# Patient Record
Sex: Female | Born: 1990 | Race: White | Hispanic: No | Marital: Single | Smoking: Never smoker
Health system: Southern US, Community
[De-identification: ages and names within clinical notes are randomized; demographics above are authoritative.]

## PROBLEM LIST (undated history)

## (undated) DIAGNOSIS — Z789 Other specified health status: Secondary | ICD-10-CM

## (undated) DIAGNOSIS — F419 Anxiety disorder, unspecified: Secondary | ICD-10-CM

## (undated) HISTORY — DX: Anxiety disorder, unspecified: F41.9

## (undated) HISTORY — PX: WISDOM TOOTH EXTRACTION: SHX21

---

## 2018-09-05 DIAGNOSIS — Z6832 Body mass index (BMI) 32.0-32.9, adult: Secondary | ICD-10-CM | POA: Diagnosis not present

## 2018-09-05 DIAGNOSIS — Z01419 Encounter for gynecological examination (general) (routine) without abnormal findings: Secondary | ICD-10-CM | POA: Diagnosis not present

## 2019-01-09 ENCOUNTER — Encounter (HOSPITAL_COMMUNITY): Payer: Self-pay | Admitting: Emergency Medicine

## 2019-01-09 ENCOUNTER — Other Ambulatory Visit: Payer: Self-pay

## 2019-01-09 ENCOUNTER — Ambulatory Visit
Admission: EM | Admit: 2019-01-09 | Discharge: 2019-01-09 | Disposition: A | Discharge status: Home / Self Care | Payer: BC Managed Care – PPO | Source: Home / Self Care

## 2019-01-09 ENCOUNTER — Inpatient Hospital Stay (HOSPITAL_COMMUNITY): Payer: BC Managed Care – PPO

## 2019-01-09 ENCOUNTER — Inpatient Hospital Stay (HOSPITAL_COMMUNITY)
Admission: EM | Admit: 2019-01-09 | Discharge: 2019-01-13 | DRG: 418 | Disposition: A | Discharge status: Home / Self Care | Payer: BC Managed Care – PPO | Attending: General Surgery | Admitting: General Surgery

## 2019-01-09 ENCOUNTER — Emergency Department (HOSPITAL_COMMUNITY): Payer: BC Managed Care – PPO

## 2019-01-09 DIAGNOSIS — K8070 Calculus of gallbladder and bile duct without cholecystitis without obstruction: Secondary | ICD-10-CM | POA: Diagnosis not present

## 2019-01-09 DIAGNOSIS — K819 Cholecystitis, unspecified: Secondary | ICD-10-CM | POA: Diagnosis not present

## 2019-01-09 DIAGNOSIS — D72819 Decreased white blood cell count, unspecified: Secondary | ICD-10-CM | POA: Diagnosis not present

## 2019-01-09 DIAGNOSIS — R1011 Right upper quadrant pain: Secondary | ICD-10-CM | POA: Diagnosis not present

## 2019-01-09 DIAGNOSIS — K851 Biliary acute pancreatitis without necrosis or infection: Principal | ICD-10-CM | POA: Diagnosis present

## 2019-01-09 DIAGNOSIS — Z87440 Personal history of urinary (tract) infections: Secondary | ICD-10-CM

## 2019-01-09 DIAGNOSIS — K8512 Biliary acute pancreatitis with infected necrosis: Secondary | ICD-10-CM | POA: Diagnosis not present

## 2019-01-09 DIAGNOSIS — Z03818 Encounter for observation for suspected exposure to other biological agents ruled out: Secondary | ICD-10-CM | POA: Diagnosis not present

## 2019-01-09 DIAGNOSIS — K8309 Other cholangitis: Secondary | ICD-10-CM | POA: Diagnosis not present

## 2019-01-09 DIAGNOSIS — K8062 Calculus of gallbladder and bile duct with acute cholecystitis without obstruction: Secondary | ICD-10-CM | POA: Diagnosis not present

## 2019-01-09 DIAGNOSIS — K8020 Calculus of gallbladder without cholecystitis without obstruction: Secondary | ICD-10-CM | POA: Diagnosis not present

## 2019-01-09 DIAGNOSIS — R17 Unspecified jaundice: Secondary | ICD-10-CM | POA: Diagnosis not present

## 2019-01-09 DIAGNOSIS — K8051 Calculus of bile duct without cholangitis or cholecystitis with obstruction: Secondary | ICD-10-CM | POA: Diagnosis not present

## 2019-01-09 DIAGNOSIS — Z793 Long term (current) use of hormonal contraceptives: Secondary | ICD-10-CM

## 2019-01-09 DIAGNOSIS — K810 Acute cholecystitis: Secondary | ICD-10-CM | POA: Diagnosis not present

## 2019-01-09 DIAGNOSIS — E876 Hypokalemia: Secondary | ICD-10-CM | POA: Diagnosis not present

## 2019-01-09 DIAGNOSIS — Z20828 Contact with and (suspected) exposure to other viral communicable diseases: Secondary | ICD-10-CM | POA: Diagnosis present

## 2019-01-09 DIAGNOSIS — R1084 Generalized abdominal pain: Secondary | ICD-10-CM

## 2019-01-09 DIAGNOSIS — R109 Unspecified abdominal pain: Secondary | ICD-10-CM | POA: Insufficient documentation

## 2019-01-09 DIAGNOSIS — K8010 Calculus of gallbladder with chronic cholecystitis without obstruction: Secondary | ICD-10-CM | POA: Diagnosis not present

## 2019-01-09 HISTORY — DX: Other specified health status: Z78.9

## 2019-01-09 LAB — URINALYSIS, ROUTINE W REFLEX MICROSCOPIC
Glucose, UA: NEGATIVE mg/dL
Hgb urine dipstick: NEGATIVE
Ketones, ur: 20 mg/dL — AB
Nitrite: NEGATIVE
Protein, ur: 30 mg/dL — AB
Specific Gravity, Urine: 1.025 (ref 1.005–1.030)
pH: 5 (ref 5.0–8.0)

## 2019-01-09 LAB — COMPREHENSIVE METABOLIC PANEL
ALT: 197 U/L — ABNORMAL HIGH (ref 0–44)
AST: 103 U/L — ABNORMAL HIGH (ref 15–41)
Albumin: 3.8 g/dL (ref 3.5–5.0)
Alkaline Phosphatase: 434 U/L — ABNORMAL HIGH (ref 38–126)
Anion gap: 11 (ref 5–15)
BUN: 5 mg/dL — ABNORMAL LOW (ref 6–20)
CO2: 23 mmol/L (ref 22–32)
Calcium: 9 mg/dL (ref 8.9–10.3)
Chloride: 104 mmol/L (ref 98–111)
Creatinine, Ser: 0.48 mg/dL (ref 0.44–1.00)
GFR calc Af Amer: >60 mL/min (ref >60)
GFR calc non Af Amer: >60 mL/min (ref >60)
Glucose, Bld: 98 mg/dL (ref 70–99)
Potassium: 3.2 mmol/L — ABNORMAL LOW (ref 3.5–5.1)
Sodium: 138 mmol/L (ref 135–145)
Total Bilirubin: 5.5 mg/dL — ABNORMAL HIGH (ref 0.3–1.2)
Total Protein: 7.4 g/dL (ref 6.5–8.1)

## 2019-01-09 LAB — CBC WITH DIFFERENTIAL/PLATELET
Abs Immature Granulocytes: 0.02 10*3/uL (ref 0.00–0.07)
Basophils Absolute: 0 10*3/uL (ref 0.0–0.1)
Basophils Relative: 0 %
Eosinophils Absolute: 0 10*3/uL (ref 0.0–0.5)
Eosinophils Relative: 0 %
HCT: 43.6 % (ref 36.0–46.0)
Hemoglobin: 14.2 g/dL (ref 12.0–15.0)
Immature Granulocytes: 0 %
Lymphocytes Relative: 20 %
Lymphs Abs: 1.2 10*3/uL (ref 0.7–4.0)
MCH: 29.6 pg (ref 26.0–34.0)
MCHC: 32.6 g/dL (ref 30.0–36.0)
MCV: 91 fL (ref 80.0–100.0)
Monocytes Absolute: 0.4 10*3/uL (ref 0.1–1.0)
Monocytes Relative: 7 %
Neutro Abs: 4.2 10*3/uL (ref 1.7–7.7)
Neutrophils Relative %: 73 %
Platelets: 193 10*3/uL (ref 150–400)
RBC: 4.79 MIL/uL (ref 3.87–5.11)
RDW: 13.6 % (ref 11.5–15.5)
WBC: 5.9 10*3/uL (ref 4.0–10.5)
nRBC: 0 % (ref 0.0–0.2)

## 2019-01-09 LAB — HCG, SERUM, QUALITATIVE: Preg, Serum: NEGATIVE

## 2019-01-09 LAB — LIPASE, BLOOD: Lipase: 775 U/L — ABNORMAL HIGH (ref 11–51)

## 2019-01-09 LAB — MAGNESIUM: Magnesium: 2 mg/dL (ref 1.7–2.4)

## 2019-01-09 LAB — SARS CORONAVIRUS 2 BY RT PCR (HOSPITAL ORDER, PERFORMED IN ~~LOC~~ HOSPITAL LAB): SARS Coronavirus 2: NEGATIVE

## 2019-01-09 MED ORDER — ONDANSETRON HCL 4 MG PO TABS: 4.0000 mg | ORAL_TABLET | Freq: Four times a day (QID) | ORAL | Status: DC | PRN

## 2019-01-09 MED ORDER — ONDANSETRON HCL 4 MG/2ML IJ SOLN
4.0000 mg | Freq: Once | INTRAMUSCULAR | Status: AC
  Administered 2019-01-09: 4 mg via INTRAVENOUS
  Filled 2019-01-09: qty 2

## 2019-01-09 MED ORDER — ONDANSETRON HCL 4 MG/2ML IJ SOLN
4.0000 mg | Freq: Four times a day (QID) | INTRAMUSCULAR | Status: DC
  Administered 2019-01-12 – 2019-01-13 (×2): 4 mg via INTRAVENOUS
  Filled 2019-01-09 (×7): qty 2

## 2019-01-09 MED ORDER — LACTATED RINGERS IV SOLN
INTRAVENOUS | Status: DC
  Administered 2019-01-09 – 2019-01-10 (×3): via INTRAVENOUS

## 2019-01-09 MED ORDER — MORPHINE SULFATE (PF) 4 MG/ML IV SOLN
4.0000 mg | Freq: Once | INTRAVENOUS | Status: AC
  Administered 2019-01-09: 4 mg via INTRAVENOUS
  Filled 2019-01-09: qty 1

## 2019-01-09 MED ORDER — ONDANSETRON HCL 4 MG/2ML IJ SOLN
4.0000 mg | Freq: Four times a day (QID) | INTRAMUSCULAR | Status: DC | PRN
  Administered 2019-01-10: 15:00:00 4 mg via INTRAVENOUS

## 2019-01-09 MED ORDER — HYDROMORPHONE HCL 1 MG/ML IJ SOLN
0.5000 mg | Freq: Four times a day (QID) | INTRAMUSCULAR | Status: DC
  Administered 2019-01-10 – 2019-01-13 (×3): 0.5 mg via INTRAVENOUS
  Filled 2019-01-09 (×6): qty 0.5

## 2019-01-09 MED ORDER — IOHEXOL 300 MG/ML  SOLN
100.0000 mL | Freq: Once | INTRAMUSCULAR | Status: AC | PRN
  Administered 2019-01-09: 17:00:00 100 mL via INTRAVENOUS

## 2019-01-09 MED ORDER — SODIUM CHLORIDE 0.9 % IV BOLUS
500.0000 mL | Freq: Once | INTRAVENOUS | Status: AC
  Administered 2019-01-09: 500 mL via INTRAVENOUS

## 2019-01-09 MED ORDER — PIPERACILLIN-TAZOBACTAM 3.375 G IVPB
3.3750 g | Freq: Three times a day (TID) | INTRAVENOUS | Status: DC
  Administered 2019-01-09 – 2019-01-13 (×11): 3.375 g via INTRAVENOUS
  Filled 2019-01-09 (×11): qty 50

## 2019-01-09 MED ORDER — ACETAMINOPHEN 650 MG RE SUPP: 650.0000 mg | Freq: Four times a day (QID) | RECTAL | Status: DC | PRN

## 2019-01-09 MED ORDER — ENOXAPARIN SODIUM 40 MG/0.4ML ~~LOC~~ SOLN
40.0000 mg | SUBCUTANEOUS | Status: DC
  Administered 2019-01-09 – 2019-01-12 (×4): 40 mg via SUBCUTANEOUS
  Filled 2019-01-09 (×4): qty 0.4

## 2019-01-09 MED ORDER — POTASSIUM CHLORIDE 10 MEQ/100ML IV SOLN
10.0000 meq | INTRAVENOUS | Status: AC
  Administered 2019-01-09 (×4): 10 meq via INTRAVENOUS
  Filled 2019-01-09 (×4): qty 100

## 2019-01-09 MED ORDER — ACETAMINOPHEN 325 MG PO TABS
650.0000 mg | ORAL_TABLET | Freq: Four times a day (QID) | ORAL | Status: DC | PRN
  Administered 2019-01-11: 650 mg via ORAL
  Filled 2019-01-09: qty 2

## 2019-01-09 MED ORDER — PIPERACILLIN-TAZOBACTAM 3.375 G IVPB 30 MIN
3.3750 g | Freq: Once | INTRAVENOUS | Status: AC
  Administered 2019-01-09: 3.375 g via INTRAVENOUS
  Filled 2019-01-09: qty 50

## 2019-01-09 MED ORDER — HYDROMORPHONE HCL 1 MG/ML IJ SOLN
1.0000 mg | INTRAMUSCULAR | Status: DC | PRN
  Administered 2019-01-10: 1 mg via INTRAVENOUS
  Filled 2019-01-09: qty 1

## 2019-01-09 NOTE — ED Triage Notes (Signed)
Upon triage pt is determined to be jaundice and is having abdominal pain , encouraged pt to go to ed for further evaluation

## 2019-01-09 NOTE — Progress Notes (Signed)
GI consultation by Mr. Walden Field, DNP and Dr. Oneida Alar appreciated. Abdominal pelvic CT reveals multiple stones in the gallbladder with gallbladder wall thickening as well as dilated bile duct with 2 lucent stones with calcified rims.  Study also shows mild changes of pancreatitis. Patient is pain-free since she received pain medication. She has biliary pancreatitis as well as gallbladder changes suggest cholecystitis and she may also have an element of cholangitis and urine analysis suggest UTI. She is and is on Zosyn. We will plan ERCP with sphincterotomy and stone extraction on 01/10/2019.  She will also need cholecystectomy. Procedure risks reviewed with the patient and she is agreeable. I also met with her husband Wille Glaser remain up-to-date on patient's condition.

## 2019-01-09 NOTE — ED Provider Notes (Addendum)
[facility] EMERGENCY DEPARTMENT Provider Note   CSN: [id number] Arrival date & time: 01/09/19  1156     History   Chief Complaint Chief Complaint  Patient presents with  . Abdominal Pain  . Jaundice    HPI Sonya Mcdonald is a 28 y.o. female without significant past medical hx who presents to the ED w/ complaints of abdominal pain & jaundice which she first noted around 11AM yesterday. Patient states sxs seem to develop fairly simultaneously- she noted a yellow discoloration to her eyes & skin & pain to the upper abdomen which has been waxing/waning since onset. Her current discomfort is a 7/10 in severity, worse with supine position & walking around, no alleviating factors. Tried some zantac w/o relief. Reports nausea w/ 1 episode of non bloody emesis. Has also noted more yellow color to her urine w/ increased urinary frequency similar to prior UTIs. Denies fever, chills, hematemesis, chest pain, dyspnea, melena, hematochezia, diarrhea, constipation, dysuria, vaginal bleeding, or vaginal discharge. She reports occassional EtOH use- states she drinks approximately 2 times per week w/ variable amounts- typically 5-6 drinks each time, last consumed EtOH a few days ago. Denies recent travel. Denies sick contacts w/ similar sxs.      HPI  History reviewed. No pertinent past medical history.  There are no active problems to display for this patient.   History reviewed. No pertinent surgical history.   OB History   No obstetric history on file.      Home Medications    Prior to Admission medications   Not on File    Family History No family history on file.  Social History Social History   Tobacco Use  . Smoking status: Never Smoker  . Smokeless tobacco: Never Used  Substance Use Topics  . Alcohol use: Yes    Comment: occ  . Drug use: Never     Allergies   Patient has no allergy information on record.  Review of Systems Review of Systems  Constitutional:  Negative for chills and fever.  Respiratory: Negative for shortness of breath.   Cardiovascular: Negative for chest pain.  Gastrointestinal: Positive for abdominal pain, nausea and vomiting. Negative for anal bleeding, blood in stool, constipation and diarrhea.  Genitourinary: Positive for frequency and urgency. Negative for dysuria, vaginal bleeding and vaginal discharge.  Skin: Positive for color change.  All other systems reviewed and are negative.    Physical Exam Updated Vital Signs BP 138/86 (BP Location: Right Arm)   Pulse 97   Temp 98 F (36.7 C) (Oral)   Resp 14   Ht '5\' 7"'  (1.702 m)   Wt 88.9 kg   LMP 12/30/2018   SpO2 96%   BMI 30.70 kg/m   Physical Exam Vitals signs and nursing note reviewed.  Constitutional:      General: She is not in acute distress.    Appearance: She is well-developed. She is not toxic-appearing.  HENT:     Head: Normocephalic and atraumatic.  Eyes:     General: Scleral icterus (mild) present.        Right eye: No discharge.        Left eye: No discharge.  Neck:     Musculoskeletal: Neck supple.  Cardiovascular:     Rate and Rhythm: Normal rate and regular rhythm.  Pulmonary:     Effort: Pulmonary effort is normal. No respiratory distress.     Breath sounds: Normal breath sounds. No wheezing, rhonchi or rales.  Abdominal:  General: There is no distension.     Palpations: Abdomen is soft.     Tenderness: There is abdominal tenderness in the right upper quadrant, epigastric area and left upper quadrant. There is no right CVA tenderness, left CVA tenderness, guarding or rebound.  Skin:    General: Skin is warm and dry.     Coloration: Skin is jaundiced (mild).     Findings: No rash.  Neurological:     Mental Status: She is alert.     Comments: Clear speech.   Psychiatric:        Behavior: Behavior normal.    ED Treatments / Results  Labs (all labs ordered are listed, but only abnormal results are displayed) Labs Reviewed   COMPREHENSIVE METABOLIC PANEL - Abnormal; Notable for the following components:      Result Value   Potassium 3.2 (*)    BUN 5 (*)    AST 103 (*)    ALT 197 (*)    Alkaline Phosphatase 434 (*)    Total Bilirubin 5.5 (*)    All other components within normal limits  LIPASE, BLOOD - Abnormal; Notable for the following components:   Lipase 775 (*)    All other components within normal limits  URINALYSIS, ROUTINE W REFLEX MICROSCOPIC - Abnormal; Notable for the following components:   Color, Urine AMBER (*)    APPearance HAZY (*)    Bilirubin Urine MODERATE (*)    Ketones, ur 20 (*)    Protein, ur 30 (*)    Leukocytes,Ua TRACE (*)    Bacteria, UA RARE (*)    All other components within normal limits  URINE CULTURE  CBC WITH DIFFERENTIAL/PLATELET  HCG, SERUM, QUALITATIVE    EKG None  Radiology US Abdomen Limited Ruq  Result Date: 01/09/2019 CLINICAL DATA:  Jaundice with right upper quadrant pain EXAM: ULTRASOUND ABDOMEN LIMITED RIGHT UPPER QUADRANT COMPARISON:  None. FINDINGS: Gallbladder: Within the gallbladder, there are multiple echogenic foci which move and shadow consistent with cholelithiasis. Largest gallstone measures 1.1 cm in length. The gallbladder wall appears mildly thickened and slightly edematous. There is no pericholecystic fluid. No sonographic Murphy sign noted by sonographer. Common bile duct: Diameter: 9 mm which is prominent. Note that portions of the distal common bile duct are obscured by gas. There is questionable focus of air within the intrahepatic biliary ductal system. Liver: No focal lesion identified. Within normal limits in parenchymal echogenicity. Portal vein is patent on color Doppler imaging with normal direction of blood flow towards the liver. IMPRESSION: 1. Cholelithiasis with mild gallbladder wall thickening. Gallbladder wall appears slightly edematous. Suspect early acute cholecystitis. 2. Prominence of the common bile duct proximally. More  distally, the common bile duct is obscured by gas. A distal calculus cannot be excluded given this circumstance. From an imaging standpoint, MRCP would be the imaging study of choice to further evaluate. There is a focal area of apparent air within the intrahepatic biliary ductal system. If patient has had previous sphincterotomy, air in the biliary ductal system could be explained on an iatrogenic basis. Given the other findings, early cholangitis is of concern given this finding. Electronically Signed   By: Lowella Grip III M.D.   On: 01/09/2019 14:13    Procedures Procedures (including critical care time)  CRITICAL CARE Performed by: Kennith Maes   Total critical care time: 30 minutes  Critical care time was exclusive of separately billable procedures and treating other patients.  Critical care was necessary to treat or  prevent imminent or life-threatening deterioration.  Critical care was time spent personally by me on the following activities: development of treatment plan with patient and/or surrogate as well as nursing, discussions with consultants, evaluation of patient's response to treatment, examination of patient, obtaining history from patient or surrogate, ordering and performing treatments and interventions, ordering and review of laboratory studies, ordering and review of radiographic studies, pulse oximetry and re-evaluation of patient's condition.  Medications Ordered in ED Medications - No data to display   Initial Impression / Assessment and Plan / ED Course  I have reviewed the triage vital signs and the nursing notes.  Pertinent labs & imaging results that were available during my care of the patient were reviewed by me and considered in my medical decision making (see chart for details).   Patient presents to the ED w/ jaundice & upper abdominal pain. She is nontoxic appearing, in no apparent distress, vitals WNL. Exam w/ mild scleral icterus & skin  jaundice w/ upper abdominal tenderness to palpation, no peritoneal signs.   DDX:  Choledocholithiasis, cholelithiasis, cholecystitis, cholangitis, pancreatitis, gastritis, hepatitis.  Plan: Labs & RUQ [place] with antiemetics, analgesics, & fluids.   Preg test: Negative UA: bilirubin, ketones, & protein present. Trace leuks, rare bacteria, contaminated- doubt UTI- culture sent.  CBC: No leukocytosis. No Anemia CMP: LFTs elevated- AST 103, ALT 197, alk phos 434, t bili 5.5. renal function preserved. Mild hypokalemia @ 3.2.  Lipase: elevated @ 775  RUQ [place]: Patient w/ cholelithiasis, findings suspicious for early cholecystitis, prominence of proximal CBD, difficult to visualize more distal portion of the CBD. Focal area of air within intrahepatic biliary ductal system- patient has not had prior sphincterotomy, given other findings per radiology early cholangitis is of concern.   Zosyn started for coverage of cholecystitis/cholangitis.  Will discuss w/ GI.   14:30: RE-EVAL: Patient states significant improvement in pain, she is resting comfortably, discussed results & plan of care thus far, provided opportunity for questions, she has confirmed understanding.   14:32: CONSULT: Discussed w/ gastroenterologist Dr. Oneida Alar who has also discussed w/ Dr. Laural Golden who will perform ERCP, requesting CT abdomen w contrast as opposed to MRCP, hospitalist admission.   14:45: CONSULT: Discussed case with hospitalist Dr. Manuella Ghazi who accepts admission.   15:32: CONSULT: Discussed w/ general surgeon Dr. Arnoldo Morale who is aware of patient.   Findings and plan of care discussed with supervising physician Dr. Roderic Palau who has evaluated patient & is in agreement.   Final Clinical Impressions(s) / ED Diagnoses   Final diagnoses:  Abdominal pain  Gallstone pancreatitis  Cholecystitis  Cholangitis    ED Discharge Orders    None       Amaryllis Dyke, PA-C 01/09/19 1536    Shelitha Magley, [place], PA-C  01/09/19 1751    Milton Ferguson, MD 01/11/19 1547

## 2019-01-09 NOTE — Progress Notes (Signed)
Pharmacy Antibiotic Note  Delorese Sellin is a 28 y.o. female admitted on 01/09/2019 with intra-abdominal infection.  Pharmacy has been consulted for Zosyn dosing.  Plan: Zosyn 3.375g IV q8h (4 hour infusion).  Monitor labs, c/s, and patient improvement.  Height: 5\' 7"  (170.2 cm) Weight: 196 lb (88.9 kg) IBW/kg (Calculated) : 61.6  Temp (24hrs), Avg:98 F (36.7 C), Min:98 F (36.7 C), Max:98 F (36.7 C)  Recent Labs  Lab 01/09/19 1309  WBC 5.9  CREATININE 0.48    Estimated Creatinine Clearance: 119.8 mL/min (by C-G formula based on SCr of 0.48 mg/dL).    Not on File  Antimicrobials this admission: Zosyn 7/16 >>     Dose adjustments this admission: N/A  Microbiology results: 7/16 UCx: pending   Thank you for allowing pharmacy to be a part of this patient's care.  Ramond Craver 01/09/2019 3:53 PM

## 2019-01-09 NOTE — ED Notes (Signed)
Hinton Dyer, RN informed to hold Lovenox tomorrow for EGD.

## 2019-01-09 NOTE — H&P (Signed)
History and Physical    Sonya Simpsonatricia Zirbel YNW:[id number] DOB: 1991/02/12 DOA: 01/09/2019  PCP: Patient, No Pcp Per   Patient coming from: Home  Chief Complaint: Abdominal pain and jaundice  HPI: Sonya Mcdonald is a 28 y.o. female with no significant past medical history who presented to the emergency department with sudden onset right upper quadrant abdominal pain along with some nausea and one episode of emesis that began yesterday.  She denies any fevers or chills, nor has she had any hematemesis, or diarrhea.  She denies any sick contacts.  She does state that her skin has been turning more yellow in color as well.  She states that the pain is dull and achy in nature and does not seem to radiate.  She denies any aggravating or alleviating factors.  She has never had pain similar to this previously.  She states that she only drinks alcohol socially and on occasion.  She denies any tobacco use.   ED Course: Vital signs are stable and patient is afebrile.  Laboratory data notable for some transaminitis that is mild with AST 103 and ALT 197 and lipase 775.  Bilirubin is mildly elevated at 5.5.  Potassium is 3.2.  Right upper quadrant abdominal ultrasound notable for cholelithiasis and findings of early acute cholecystitis along with possible bile duct obstruction.  EDP has spoken with GI who will follow-up CT scan and consider ERCP.  COVID test pending.  Pregnancy test negative.  Review of Systems: As per HPI otherwise 10 point review of systems negative.   History reviewed. No pertinent past medical history.  History reviewed. No pertinent surgical history.   reports that she has never smoked. She has never used smokeless tobacco. She reports current alcohol use. She reports that she does not use drugs.  Not on File  History reviewed. No pertinent family history.  Prior to Admission medications   Medication Sig Start Date End Date Taking? Authorizing Provider  norgestimate-ethinyl  estradiol (SPRINTEC 28) 0.25-35 MG-MCG tablet Take 1 tablet by mouth daily.  08/23/18  Yes [provider]    Physical Exam: Vitals:   01/09/19 1204 01/09/19 1205 01/09/19 1330  BP: 138/86  112/66  Pulse: 97  81  Resp: 14  18  Temp: 98 F (36.7 C)    TempSrc: Oral    SpO2: 96%  99%  Weight:  88.9 kg   Height:  5\' 7"  (1.702 m)     Constitutional: NAD, calm, comfortable, does not appear jaundiced on my examination Vitals:   01/09/19 1204 01/09/19 1205 01/09/19 1330  BP: 138/86  112/66  Pulse: 97  81  Resp: 14  18  Temp: 98 F (36.7 C)    TempSrc: Oral    SpO2: 96%  99%  Weight:  88.9 kg   Height:  5\' 7"  (1.702 m)    Eyes: lids and conjunctivae normal ENMT: Mucous membranes are moist.  Neck: normal, supple Respiratory: clear to auscultation bilaterally. Normal respiratory effort. No accessory muscle use.  Cardiovascular: Regular rate and rhythm, no murmurs. No extremity edema. Abdomen: Minimal tenderness with positive Murphy sign, no distention. Bowel sounds positive.  Musculoskeletal:  No joint deformity upper and lower extremities.   Skin: no rashes, lesions, ulcers.  Psychiatric: Normal judgment and insight. Alert and oriented x 3. Normal mood.   Labs on Admission: I have personally reviewed following labs and imaging studies  CBC: Recent Labs  Lab 01/09/19 1309  WBC 5.9  NEUTROABS 4.2  HGB 14.2  HCT  43.6  MCV 91.0  PLT 725   Basic Metabolic Panel: Recent Labs  Lab 01/09/19 1309  NA 138  K 3.2*  CL 104  CO2 23  GLUCOSE 98  BUN 5*  CREATININE 0.48  CALCIUM 9.0   GFR: Estimated Creatinine Clearance: 119.8 mL/min (by C-G formula based on SCr of 0.48 mg/dL). Liver Function Tests: Recent Labs  Lab 01/09/19 1309  AST 103*  ALT 197*  ALKPHOS 434*  BILITOT 5.5*  PROT 7.4  ALBUMIN 3.8   Recent Labs  Lab 01/09/19 1309  LIPASE 775*   No results for input(s): AMMONIA in the last 168 hours. Coagulation Profile: No results for input(s):  INR, PROTIME in the last 168 hours. Cardiac Enzymes: No results for input(s): CKTOTAL, CKMB, CKMBINDEX, TROPONINI in the last 168 hours. BNP (last 3 results) No results for input(s): PROBNP in the last 8760 hours. HbA1C: No results for input(s): HGBA1C in the last 72 hours. CBG: No results for input(s): GLUCAP in the last 168 hours. Lipid Profile: No results for input(s): CHOL, HDL, LDLCALC, TRIG, CHOLHDL, LDLDIRECT in the last 72 hours. Thyroid Function Tests: No results for input(s): TSH, T4TOTAL, FREET4, T3FREE, THYROIDAB in the last 72 hours. Anemia Panel: No results for input(s): VITAMINB12, FOLATE, FERRITIN, TIBC, IRON, RETICCTPCT in the last 72 hours. Urine analysis:    Component Value Date/Time   COLORURINE AMBER (A) 01/09/2019 1313   APPEARANCEUR HAZY (A) 01/09/2019 1313   LABSPEC 1.025 01/09/2019 1313   PHURINE 5.0 01/09/2019 1313   GLUCOSEU NEGATIVE 01/09/2019 1313   HGBUR NEGATIVE 01/09/2019 1313   BILIRUBINUR MODERATE (A) 01/09/2019 1313   KETONESUR 20 (A) 01/09/2019 1313   PROTEINUR 30 (A) 01/09/2019 1313   NITRITE NEGATIVE 01/09/2019 1313   LEUKOCYTESUR TRACE (A) 01/09/2019 1313    Radiological Exams on Admission: US Abdomen Limited Ruq  Result Date: 01/09/2019 CLINICAL DATA:  Jaundice with right upper quadrant pain EXAM: ULTRASOUND ABDOMEN LIMITED RIGHT UPPER QUADRANT COMPARISON:  None. FINDINGS: Gallbladder: Within the gallbladder, there are multiple echogenic foci which move and shadow consistent with cholelithiasis. Largest gallstone measures 1.1 cm in length. The gallbladder wall appears mildly thickened and slightly edematous. There is no pericholecystic fluid. No sonographic Murphy sign noted by sonographer. Common bile duct: Diameter: 9 mm which is prominent. Note that portions of the distal common bile duct are obscured by gas. There is questionable focus of air within the intrahepatic biliary ductal system. Liver: No focal lesion identified. Within normal  limits in parenchymal echogenicity. Portal vein is patent on color Doppler imaging with normal direction of blood flow towards the liver. IMPRESSION: 1. Cholelithiasis with mild gallbladder wall thickening. Gallbladder wall appears slightly edematous. Suspect early acute cholecystitis. 2. Prominence of the common bile duct proximally. More distally, the common bile duct is obscured by gas. A distal calculus cannot be excluded given this circumstance. From an imaging standpoint, MRCP would be the imaging study of choice to further evaluate. There is a focal area of apparent air within the intrahepatic biliary ductal system. If patient has had previous sphincterotomy, air in the biliary ductal system could be explained on an iatrogenic basis. Given the other findings, early cholangitis is of concern given this finding. Electronically Signed   By: Lowella Grip III M.D.   On: 01/09/2019 14:13    Assessment/Plan Active Problems:   Acute cholecystitis    Acute cholecystitis with choledocholithiasis -Consult to general surgery and GI for assistance in management -CT scan of the abdomen pending and  GI aware for possible ERCP -N.p.o. except medications and maintain on aggressive IV fluid -Continue IV Zosyn -Zofran as needed for nausea or vomiting -Pain medications IV to be ordered for pain control -Repeat a.m. labs with CMP  Mild hypokalemia -Replete IV -Recheck labs in a.m. along with magnesium   DVT prophylaxis: Lovenox Code Status: Full Family Communication: None at bedside Disposition Plan:Admit for GI and Gen Surg evaluation  Consults called:GI and GS Admission status: Inpatient, MedSurg   Icelyn Navarrete Hoover BrunetteD [place] Maul[place] DO [facility] Pager [phone]  If 7PM-7AM, please contact night-coverage [email or web address] Password [email or web address]  01/09/2019, 3:20 PM

## 2019-01-09 NOTE — ED Triage Notes (Signed)
Pt states that she has been having upper right abd pain since yesterday and she is also yellow

## 2019-01-09 NOTE — Consult Note (Signed)
Referring Provider: No ref. provider found Primary Care Physician:  Patient, No Pcp Per Primary Gastroenterologist:  Dr. Darrick PennaFields (previously unassigned)  Date of Admission: 01/09/19 Date of Consultation: 01/09/19  Reason for Consultation:  Abdominal pain, elevated LFTs, elevated lipase, dilated CBD  HPI:  Sonya Mcdonald is a 28 y.o. female with no significant past medical history who presented to the ER with complaints of abdominal pain, nausea, non-bloody emesis, and yellowing of her skin and urine. Reviewed ER provider notes.  She first noticed her abdominal pain yellowing 11 AM yesterday which spontaneously developed.  Abdominal pain waxing and waning since onset and currently moderately severe in intensity with 7 out of 10.  Worsened with walking around in supine position.  Has tried Zantac without relief.  One episode of nonbloody emesis denies fever, chills, hematochezia, melena.  Occasional alcohol use about twice a week from 5-6 drinks per sitting with last consumption a few days ago.  Her vitals are stable in emergency department.  Labs including CBC was normal, CMP with elevated AST/ALT at 103/197, elevated alkaline phosphatase of 434, elevated bilirubin at 5.5.  Lipase significantly elevated at 775.  Serum pregnancy test negative.  COVID-19 testing pending.  Right upper quadrant ultrasound found likely early cholecystitis with cholelithiasis noting multiple echogenic foci in the gallbladder and gallbladder wall thickening and edema.  Also noted CBD prominence at 9 mm with distal CBD obscured by gas and distal calculus cannot be excluded.  Recommended further evaluation.  Focal air in the intrahepatic biliary ductal system query recent sphincterotomy versus possible early cholangitis.  Today she states she was in her usual state of health until yesterday around 6211 AM she began having yellowing of her skin and eyes as well as significant abdominal pain worse at the epigastric area but  notable in her generalized abdomen.  Only one episode of emesis without obvious blood.  No hematochezia or melena.  She does complain of chronic constipation but this is not been evaluated previously.  Denies hematochezia, melena, fever, chills.  Drinks alcohol about twice a week with about 5 drinks per sitting, denies recreational drugs.  Her last oral intake was this morning a "mini sausage biscuit" she had while eating breakfast with her children.  No other GI complaints.  History reviewed. No pertinent past medical history.  History reviewed. No pertinent surgical history.  Prior to Admission medications   Medication Sig Start Date End Date Taking? Authorizing Provider  norgestimate-ethinyl estradiol (SPRINTEC 28) 0.25-35 MG-MCG tablet Take 1 tablet by mouth daily.  08/23/18  Yes [provider]    Current Facility-Administered Medications  Medication Dose Route Frequency Provider Last Rate Last Dose  . piperacillin-tazobactam (ZOSYN) IVPB 3.375 g  3.375 g Intravenous Once Petrucelli, Samantha R, PA-C 100 mL/hr at 01/09/19 1438 3.375 g at 01/09/19 1438   Current Outpatient Medications  Medication Sig Dispense Refill  . norgestimate-ethinyl estradiol (SPRINTEC 28) 0.25-35 MG-MCG tablet Take 1 tablet by mouth daily.       Allergies as of 01/09/2019  . (Not on File)    History reviewed. No pertinent family history.  Social History   Socioeconomic History  . Marital status: Single    Spouse name: Not on file  . Number of children: Not on file  . Years of education: Not on file  . Highest education level: Not on file  Occupational History  . Not on file  Social Needs  . Financial resource strain: Not on file  . Food insecurity  Worry: Not on file    Inability: Not on file  . Transportation needs    Medical: Not on file    Non-medical: Not on file  Tobacco Use  . Smoking status: Never Smoker  . Smokeless tobacco: Never Used  Substance and Sexual Activity  .  Alcohol use: Yes    Comment: occ  . Drug use: Never  . Sexual activity: Yes    Birth control/protection: Pill  Lifestyle  . Physical activity    Days per week: Not on file    Minutes per session: Not on file  . Stress: Not on file  Relationships  . Social Herbalist on phone: Not on file    Gets together: Not on file    Attends religious service: Not on file    Active member of club or organization: Not on file    Attends meetings of clubs or organizations: Not on file    Relationship status: Not on file  . Intimate partner violence    Fear of current or ex partner: Not on file    Emotionally abused: Not on file    Physically abused: Not on file    Forced sexual activity: Not on file  Other Topics Concern  . Not on file  Social History Narrative  . Not on file    Review of Systems: General: Negative for anorexia, weight loss, fever, chills, fatigue, weakness. ENT: Negative for hoarseness, difficulty swallowing. CV: Negative for chest pain, angina, palpitations, peripheral edema.  Respiratory: Negative for dyspnea at rest, cough, sputum, wheezing.  GI: See history of present illness. GU:  Darkened yellow urine MS: Negative for joint pain, low back pain.  Derm: Negative for rash or itching.  Endo: Negative for unusual weight change.  Heme: Negative for bruising or bleeding. Allergy: Negative for rash or hives.  Physical Exam: Vital signs in last 24 hours: Temp:  [98 F (36.7 C)] 98 F (36.7 C) (07/16 1204) Pulse Rate:  [81-97] 81 (07/16 1330) Resp:  [14-18] 18 (07/16 1330) BP: (112-138)/(66-86) 112/66 (07/16 1330) SpO2:  [96 %-99 %] 99 % (07/16 1330) Weight:  [88.9 kg] 88.9 kg (07/16 1205)   General:   Alert,  Well-developed, well-nourished, pleasant and cooperative in NAD Head:  Normocephalic and atraumatic. Eyes:  Mild scleral icterus. Ears:  Normal auditory acuity. Neck:  Supple; no masses or thyromegaly. Lungs:  Clear throughout to auscultation.  No wheezes, crackles, or rhonchi. No acute distress. Heart:  Regular rate and rhythm; no murmurs, clicks, rubs,  or gallops. Abdomen:  Soft, and nondistended. Generalized abdominal TTP worse in the epigastrium. No masses, hepatosplenomegaly or hernias noted. Normal bowel sounds, without guarding, and without rebound.   Rectal:  Deferred until time of colonoscopy.   Msk:  Symmetrical without gross deformities. Pulses:  Normal bilateral DP pulses noted. Extremities:  Without clubbing or edema. Neurologic:  Alert and  oriented x4;  grossly normal neurologically. Psych:  Alert and cooperative. Normal mood and affect.  Intake/Output from previous day: No intake/output data recorded. Intake/Output this shift: Total I/O In: 500 [IV Piggyback:500] Out: -   Lab Results: Recent Labs    01/09/19 1309  WBC 5.9  HGB 14.2  HCT 43.6  PLT 193   BMET Recent Labs    01/09/19 1309  NA 138  K 3.2*  CL 104  CO2 23  GLUCOSE 98  BUN 5*  CREATININE 0.48  CALCIUM 9.0   LFT Recent Labs    01/09/19  1309  PROT 7.4  ALBUMIN 3.8  AST 103*  ALT 197*  ALKPHOS 434*  BILITOT 5.5*   PT/INR No results for input(s): LABPROT, INR in the last 72 hours. Hepatitis Panel No results for input(s): HEPBSAG, HCVAB, HEPAIGM, HEPBIGM in the last 72 hours. C-Diff No results for input(s): CDIFFTOX in the last 72 hours.  Studies/Results: [place] Abdomen Limited Ruq  Result Date: 01/09/2019 CLINICAL DATA:  Jaundice with right upper quadrant pain EXAM: ULTRASOUND ABDOMEN LIMITED RIGHT UPPER QUADRANT COMPARISON:  None. FINDINGS: Gallbladder: Within the gallbladder, there are multiple echogenic foci which move and shadow consistent with cholelithiasis. Largest gallstone measures 1.1 cm in length. The gallbladder wall appears mildly thickened and slightly edematous. There is no pericholecystic fluid. No sonographic Murphy sign noted by sonographer. Common bile duct: Diameter: 9 mm which is prominent. Note that  portions of the distal common bile duct are obscured by gas. There is questionable focus of air within the intrahepatic biliary ductal system. Liver: No focal lesion identified. Within normal limits in parenchymal echogenicity. Portal vein is patent on color Doppler imaging with normal direction of blood flow towards the liver. IMPRESSION: 1. Cholelithiasis with mild gallbladder wall thickening. Gallbladder wall appears slightly edematous. Suspect early acute cholecystitis. 2. Prominence of the common bile duct proximally. More distally, the common bile duct is obscured by gas. A distal calculus cannot be excluded given this circumstance. From an imaging standpoint, MRCP would be the imaging study of choice to further evaluate. There is a focal area of apparent air within the intrahepatic biliary ductal system. If patient has had previous sphincterotomy, air in the biliary ductal system could be explained on an iatrogenic basis. Given the other findings, early cholangitis is of concern given this finding. Electronically Signed   By: Bretta BangWilliam  Woodruff III M.D.   On: 01/09/2019 14:13    Impression: Very pleasant 28 year old female who presents for new onset of moderate to severe waxing/waning abdominal pain, nausea, emesis, yellowing of her skin and eyes.  Ultrasound imaging shows multiple gallstones and likely early acute cholecystitis as well as prominent common bile duct and air in the common bile duct concerning for choledocholithiasis and possible early cholangitis.  Labs demonstrate elevated LFTs as well as elevated lipase.  No leukocytosis or anemia.  Overall likely with choledocholithiasis and gallstone pancreatitis as well as acute cholecystitis and possible early cholangitis.  Vitals are stable, no features of moderate or severe cholangitis nor sepsis; will need to monitor for any emerging symptoms.  CT imaging is ordered and pending which will allow further evaluation for any severe  pancreatitis.  Plan: 1. Keep NPO 2. Zosyn q 6 hours for possible early cholangitis 3. Monitor LFTs 4. Agree with CT imaging 5. Planned ERCP in the morning with Dr. Karilyn Cotaehman 6. Would likely benefit from surgical evaluation at some point during IP stay 7. Supportive measures 8. Further recommendations pending labs, CT, and ERCP results.   Thank you for allowing us to participate in the care of Earl LagosPatricia Behringer  Zoriah Pulice, DNP, AGNP-C Adult & Gerontological Nurse Practitioner [facility]   LOS: 0 days     01/09/2019, 2:51 PM

## 2019-01-10 ENCOUNTER — Inpatient Hospital Stay (HOSPITAL_COMMUNITY): Payer: BC Managed Care – PPO | Admitting: Anesthesiology

## 2019-01-10 ENCOUNTER — Inpatient Hospital Stay (HOSPITAL_COMMUNITY): Payer: BC Managed Care – PPO

## 2019-01-10 ENCOUNTER — Encounter (HOSPITAL_COMMUNITY)
Admission: EM | Disposition: A | Discharge status: Home / Self Care | Payer: Self-pay | Source: Home / Self Care | Attending: Internal Medicine

## 2019-01-10 ENCOUNTER — Encounter (HOSPITAL_COMMUNITY): Payer: Self-pay | Admitting: Anesthesiology

## 2019-01-10 DIAGNOSIS — K8051 Calculus of bile duct without cholangitis or cholecystitis with obstruction: Secondary | ICD-10-CM

## 2019-01-10 DIAGNOSIS — K851 Biliary acute pancreatitis without necrosis or infection: Principal | ICD-10-CM

## 2019-01-10 HISTORY — PX: ERCP: SHX5425

## 2019-01-10 HISTORY — PX: SPHINCTEROTOMY: SHX5544

## 2019-01-10 HISTORY — PX: REMOVAL OF STONES: SHX5545

## 2019-01-10 LAB — COMPREHENSIVE METABOLIC PANEL
ALT: 136 U/L — ABNORMAL HIGH (ref 0–44)
AST: 62 U/L — ABNORMAL HIGH (ref 15–41)
Albumin: 3.2 g/dL — ABNORMAL LOW (ref 3.5–5.0)
Alkaline Phosphatase: 332 U/L — ABNORMAL HIGH (ref 38–126)
Anion gap: 14 (ref 5–15)
BUN: <5 mg/dL — ABNORMAL LOW (ref 6–20)
CO2: 22 mmol/L (ref 22–32)
Calcium: 8.6 mg/dL — ABNORMAL LOW (ref 8.9–10.3)
Chloride: 110 mmol/L (ref 98–111)
Creatinine, Ser: 0.47 mg/dL (ref 0.44–1.00)
GFR calc Af Amer: >60 mL/min (ref >60)
GFR calc non Af Amer: >60 mL/min (ref >60)
Glucose, Bld: 76 mg/dL (ref 70–99)
Potassium: 4 mmol/L (ref 3.5–5.1)
Sodium: 146 mmol/L — ABNORMAL HIGH (ref 135–145)
Total Bilirubin: 3.7 mg/dL — ABNORMAL HIGH (ref 0.3–1.2)
Total Protein: 6.6 g/dL (ref 6.5–8.1)

## 2019-01-10 LAB — URINE CULTURE: Culture: NO GROWTH

## 2019-01-10 LAB — CBC
HCT: 39.8 % (ref 36.0–46.0)
Hemoglobin: 12.8 g/dL (ref 12.0–15.0)
MCH: 29.4 pg (ref 26.0–34.0)
MCHC: 32.2 g/dL (ref 30.0–36.0)
MCV: 91.5 fL (ref 80.0–100.0)
Platelets: 173 10*3/uL (ref 150–400)
RBC: 4.35 MIL/uL (ref 3.87–5.11)
RDW: 13.8 % (ref 11.5–15.5)
WBC: 3.8 10*3/uL — ABNORMAL LOW (ref 4.0–10.5)
nRBC: 0 % (ref 0.0–0.2)

## 2019-01-10 LAB — LIPASE, BLOOD
Lipase: 289 U/L — ABNORMAL HIGH (ref 11–51)
Lipase: 112 U/L — ABNORMAL HIGH (ref 11–51)

## 2019-01-10 SURGERY — ERCP, WITH INTERVENTION IF INDICATED
Anesthesia: General

## 2019-01-10 MED ORDER — MIDAZOLAM HCL 2 MG/2ML IJ SOLN
INTRAMUSCULAR | Status: AC
  Filled 2019-01-10: qty 2

## 2019-01-10 MED ORDER — PROPOFOL 10 MG/ML IV BOLUS
INTRAVENOUS | Status: DC | PRN
  Administered 2019-01-10: 150 mg via INTRAVENOUS

## 2019-01-10 MED ORDER — SODIUM CHLORIDE 0.9 % IV SOLN
INTRAVENOUS | Status: AC
  Filled 2019-01-10: qty 100

## 2019-01-10 MED ORDER — GLUCAGON HCL RDNA (DIAGNOSTIC) 1 MG IJ SOLR
INTRAMUSCULAR | Status: DC | PRN
  Administered 2019-01-10 (×3): .25 mg via INTRAVENOUS

## 2019-01-10 MED ORDER — SODIUM CHLORIDE 0.9 % IV SOLN
INTRAVENOUS | Status: DC | PRN
  Administered 2019-01-10: 14:00:00 20 mL

## 2019-01-10 MED ORDER — GLUCAGON HCL RDNA (DIAGNOSTIC) 1 MG IJ SOLR
INTRAMUSCULAR | Status: AC
  Filled 2019-01-10: qty 2

## 2019-01-10 MED ORDER — LACTATED RINGERS IV SOLN: INTRAVENOUS | Status: DC

## 2019-01-10 MED ORDER — FENTANYL CITRATE (PF) 100 MCG/2ML IJ SOLN
INTRAMUSCULAR | Status: DC | PRN
  Administered 2019-01-10: 75 ug via INTRAVENOUS
  Administered 2019-01-10: 25 ug via INTRAVENOUS

## 2019-01-10 MED ORDER — SUCCINYLCHOLINE CHLORIDE 200 MG/10ML IV SOSY
PREFILLED_SYRINGE | INTRAVENOUS | Status: AC
  Filled 2019-01-10: qty 10

## 2019-01-10 MED ORDER — ONDANSETRON HCL 4 MG/2ML IJ SOLN
INTRAMUSCULAR | Status: DC | PRN
  Administered 2019-01-10: 4 mg via INTRAVENOUS

## 2019-01-10 MED ORDER — SUCCINYLCHOLINE 20MG/ML (10ML) SYRINGE FOR MEDFUSION PUMP - OPTIME
INTRAMUSCULAR | Status: DC | PRN
  Administered 2019-01-10: 120 mg via INTRAVENOUS

## 2019-01-10 MED ORDER — ONDANSETRON HCL 4 MG/2ML IJ SOLN
INTRAMUSCULAR | Status: AC
  Filled 2019-01-10: qty 2

## 2019-01-10 MED ORDER — PROPOFOL 10 MG/ML IV BOLUS
INTRAVENOUS | Status: AC
  Filled 2019-01-10: qty 20

## 2019-01-10 MED ORDER — WATER FOR IRRIGATION, STERILE IR SOLN
Status: DC | PRN
  Administered 2019-01-10: 20 mL

## 2019-01-10 MED ORDER — ROCURONIUM BROMIDE 10 MG/ML (PF) SYRINGE
PREFILLED_SYRINGE | INTRAVENOUS | Status: AC
  Filled 2019-01-10: qty 10

## 2019-01-10 MED ORDER — SIMETHICONE 40 MG/0.6ML PO SUSP
ORAL | Status: AC
  Filled 2019-01-10: qty 0.6

## 2019-01-10 MED ORDER — ROCURONIUM 10MG/ML (10ML) SYRINGE FOR MEDFUSION PUMP - OPTIME
INTRAVENOUS | Status: DC | PRN
  Administered 2019-01-10: 6 mg via INTRAVENOUS

## 2019-01-10 MED ORDER — MIDAZOLAM HCL 5 MG/5ML IJ SOLN
INTRAMUSCULAR | Status: DC | PRN
  Administered 2019-01-10: 2 mg via INTRAVENOUS

## 2019-01-10 MED ORDER — FENTANYL CITRATE (PF) 100 MCG/2ML IJ SOLN
INTRAMUSCULAR | Status: AC
  Filled 2019-01-10: qty 2

## 2019-01-10 NOTE — Progress Notes (Signed)
Subjective  Patient states pain returned this morning and she had to take her pain medication.  She is now pain-free.  He states her urine color remains orange. She denies chest pain or shortness of breath.   Objective: Blood pressure 117/69, pulse (!) 58, temperature 98.6 F (37 C), resp. rate 17, height '5\' 7"'  (1.702 m), weight 88.9 kg, last menstrual period 12/30/2018, SpO2 97 %. Scleral icterus has decreased since yesterday. Abdomen is full.  Abdomen is soft with mild midepigastric tenderness.  No organomegaly or masses.   Labs/studies Results:  CBC Latest Ref Rng & Units 01/10/2019 01/09/2019  WBC 4.0 - 10.5 K/uL 3.8(L) 5.9  Hemoglobin 12.0 - 15.0 g/dL 12.8 14.2  Hematocrit 36.0 - 46.0 % 39.8 43.6  Platelets 150 - 400 K/uL 173 193    CMP Latest Ref Rng & Units 01/10/2019 01/09/2019  Glucose 70 - 99 mg/dL 76 98  BUN 6 - 20 mg/dL <5(L) 5(L)  Creatinine 0.44 - 1.00 mg/dL 0.47 0.48  Sodium 135 - 145 mmol/L 146(H) 138  Potassium 3.5 - 5.1 mmol/L 4.0 3.2(L)  Chloride 98 - 111 mmol/L 110 104  CO2 22 - 32 mmol/L 22 23  Calcium 8.9 - 10.3 mg/dL 8.6(L) 9.0  Total Protein 6.5 - 8.1 g/dL 6.6 7.4  Total Bilirubin 0.3 - 1.2 mg/dL 3.7(H) 5.5(H)  Alkaline Phos 38 - 126 U/L 332(H) 434(H)  AST 15 - 41 U/L 62(H) 103(H)  ALT 0 - 44 U/L 136(H) 197(H)    Hepatic Function Latest Ref Rng & Units 01/10/2019 01/09/2019  Total Protein 6.5 - 8.1 g/dL 6.6 7.4  Albumin 3.5 - 5.0 g/dL 3.2(L) 3.8  AST 15 - 41 U/L 62(H) 103(H)  ALT 0 - 44 U/L 136(H) 197(H)  Alk Phosphatase 38 - 126 U/L 332(H) 434(H)  Total Bilirubin 0.3 - 1.2 mg/dL 3.7(H) 5.5(H)    Serum lipase 289.  It was 775 yesterday.  Assessment:  #1.  Choledocholithiasis resulting in jaundice and mild biliary pancreatitis.  Bilirubin and transaminases are coming down.  Stones are moderate size and doubt that they will pass spontaneously.  Patient to undergo therapeutic ERCP today.  Patient may also have cholangitis and has been on Zosyn since  admission. CBC significant for WBC of 3.8.  CBC will be repeated in the a.m.   Lovenox on hold.  She received a dose at 4 PM yesterday.  #2.  Calculus cholecystitis.  Patient has been evaluated by Dr. Arnoldo Morale and will undergo cholecystectomy on 01/13/2019.   Plan:  ERCP with sphincterotomy and stone extraction later today. Again procedure explained to the patient and she is agreeable.

## 2019-01-10 NOTE — Anesthesia Preprocedure Evaluation (Addendum)
Anesthesia Evaluation  Patient identified by MRN, date of birth, ID band Patient awake    Reviewed: Allergy & Precautions, H&P , NPO status , Patient's Chart, lab work & pertinent test results, reviewed documented beta blocker date and time   Airway Mallampati: I  TM Distance: >3 FB Neck ROM: full    Dental  (+) Teeth Intact   Pulmonary neg pulmonary ROS,    Pulmonary exam normal        Cardiovascular Exercise Tolerance: Good negative cardio ROS Normal cardiovascular exam     Neuro/Psych negative neurological ROS  negative psych ROS   GI/Hepatic negative GI ROS, Neg liver ROS,   Endo/Other  negative endocrine ROS  Renal/GU negative Renal ROS  negative genitourinary   Musculoskeletal   Abdominal   Peds  Hematology   Anesthesia Other Findings   Reproductive/Obstetrics negative OB ROS                            Anesthesia Physical Anesthesia Plan  ASA: I  Anesthesia Plan: General   Post-op Pain Management:    Induction:   PONV Risk Score and Plan: 2 and TIVA  Airway Management Planned:   Additional Equipment:   Intra-op Plan:   Post-operative Plan:   Informed Consent:   Plan Discussed with: CRNA  Anesthesia Plan Comments:         Anesthesia Quick Evaluation

## 2019-01-10 NOTE — Transfer of Care (Signed)
Immediate Anesthesia Transfer of Care Note  Patient: Kathie Posa  Procedure(s) Performed: ENDOSCOPIC RETROGRADE CHOLANGIOPANCREATOGRAPHY (ERCP) (N/A ) SPHINCTEROTOMY (N/A ) BALLOON DILATATION AND STONE EXTRACTION (N/A )  Patient Location: PACU  Anesthesia Type:General  Level of Consciousness: awake  Airway & Oxygen Therapy: Patient Spontanous Breathing  Post-op Assessment: Report given to RN  Post vital signs: Reviewed and stable  Last Vitals:  Vitals Value Taken Time  BP 130/83 01/10/19 1418  Temp    Pulse 85 01/10/19 1420  Resp 17 01/10/19 1420  SpO2 92 % 01/10/19 1420  Vitals shown include unvalidated device data.  Last Pain:  Vitals:   01/10/19 1226  TempSrc:   PainSc: 0-No pain      Patients Stated Pain Goal: 4 (16/94/50 3888)  Complications: No apparent anesthesia complications

## 2019-01-10 NOTE — Consult Note (Signed)
Reason for Consult: Gallstone pancreatitis Referring Physician: Dr. Joellyn Haff Shaikh is an 28 y.o. female.  HPI: Patient is a 28 year old white female who presented to the emergency room with a sudden onset of right upper quadrant abdominal pain and nausea.  She was noted in the emergency room to have significant elevation of her liver enzyme tests as well as her lipase.  Ultrasound gallbladder revealed cholecystitis with cholelithiasis and a dilated common bile duct.  She has been seen by Dr. Karilyn Cota of gastroenterology who will be performing an ERCP today for gallstone pancreatitis with possible cholangitis.  I have been asked to see the patient concerning the need for cholecystectomy prior to discharge.  Currently the patient has a pain level 4 out of 10.  Past Medical History:  Diagnosis Date  . Medical history non-contributory     Past Surgical History:  Procedure Laterality Date  . NO PAST SURGERIES      History reviewed. No pertinent family history.  Social History:  reports that she has never smoked. She has never used smokeless tobacco. She reports current alcohol use. She reports that she does not use drugs.  Allergies: Not on File  Medications: I have reviewed the patient's current medications.  Results for orders placed or performed during the hospital encounter of 01/09/19 (from the past 48 hour(s))  CBC with Differential     Status: None   Collection Time: 01/09/19  1:09 PM  Result Value Ref Range   WBC 5.9 4.0 - 10.5 K/uL   RBC 4.79 3.87 - 5.11 MIL/uL   Hemoglobin 14.2 12.0 - 15.0 g/dL   HCT 16.1 09.6 - 04.5 %   MCV 91.0 80.0 - 100.0 fL   MCH 29.6 26.0 - 34.0 pg   MCHC 32.6 30.0 - 36.0 g/dL   RDW 40.9 81.1 - 91.4 %   Platelets 193 150 - 400 K/uL   nRBC 0.0 0.0 - 0.2 %   Neutrophils Relative % 73 %   Neutro Abs 4.2 1.7 - 7.7 K/uL   Lymphocytes Relative 20 %   Lymphs Abs 1.2 0.7 - 4.0 K/uL   Monocytes Relative 7 %   Monocytes Absolute 0.4 0.1 - 1.0 K/uL    Eosinophils Relative 0 %   Eosinophils Absolute 0.0 0.0 - 0.5 K/uL   Basophils Relative 0 %   Basophils Absolute 0.0 0.0 - 0.1 K/uL   Immature Granulocytes 0 %   Abs Immature Granulocytes 0.02 0.00 - 0.07 K/uL    Comment: Performed at [facility], [address], [place], [place] [address]  Comprehensive metabolic panel     Status: Abnormal   Collection Time: 01/09/19  1:09 PM  Result Value Ref Range   Sodium 138 135 - 145 mmol/L   Potassium 3.2 (L) 3.5 - 5.1 mmol/L   Chloride 104 98 - 111 mmol/L   CO2 23 22 - 32 mmol/L   Glucose, Bld 98 70 - 99 mg/dL   BUN 5 (L) 6 - 20 mg/dL   Creatinine, Ser 6.21 0.44 - 1.00 mg/dL   Calcium 9.0 8.9 - 30.8 mg/dL   Total Protein 7.4 6.5 - 8.1 g/dL   Albumin 3.8 3.5 - 5.0 g/dL   AST 657 (H) 15 - 41 U/L   ALT 197 (H) 0 - 44 U/L   Alkaline Phosphatase 434 (H) 38 - 126 U/L   Total Bilirubin 5.5 (H) 0.3 - 1.2 mg/dL   GFR calc non Af Amer >60 >60 mL/min   GFR  calc Af Amer >60 >60 mL/min   Anion gap 11 5 - 15    Comment: Performed at [facility], [address] [place], [place] [address]  Lipase, blood     Status: Abnormal   Collection Time: 01/09/19  1:09 PM  Result Value Ref Range   Lipase 775 (H) 11 - 51 U/L    Comment: RESULTS CONFIRMED BY MANUAL DILUTION Performed at [facility], [address] [place], [place] [address]   hCG, serum, qualitative     Status: None   Collection Time: 01/09/19  1:09 PM  Result Value Ref Range   Preg, Serum NEGATIVE NEGATIVE    Comment:        THE SENSITIVITY OF THIS METHODOLOGY IS >10 mIU/mL. Performed at [facility], [address] [place], [place] [address]   Urinalysis, Routine w reflex microscopic     Status: Abnormal   Collection Time: 01/09/19  1:13 PM  Result Value Ref Range   Color, Urine AMBER (A) YELLOW    Comment: BIOCHEMICALS MAY BE AFFECTED BY COLOR   APPearance HAZY (A) CLEAR   Specific Gravity, Urine 1.025 1.005 - 1.030   pH 5.0 5.0 - 8.0   Glucose, UA NEGATIVE NEGATIVE mg/dL   Hgb  urine dipstick NEGATIVE NEGATIVE   Bilirubin Urine MODERATE (A) NEGATIVE   Ketones, ur 20 (A) NEGATIVE mg/dL   Protein, ur 30 (A) NEGATIVE mg/dL   Nitrite NEGATIVE NEGATIVE   Leukocytes,Ua TRACE (A) NEGATIVE   RBC / HPF 6-10 0 - 5 RBC/hpf   WBC, UA 11-20 0 - 5 WBC/hpf   Bacteria, UA RARE (A) NONE SEEN   Squamous Epithelial / LPF 21-50 0 - 5   Mucus PRESENT     Comment: Performed at [facility], [address] [place], [place] [address]  SARS Coronavirus 2 (CEPHEID - Performed in [facility] hospital lab), Hosp Order     Status: None   Collection Time: 01/09/19  2:38 PM   Specimen: Nasopharyngeal Swab  Result Value Ref Range   SARS Coronavirus 2 NEGATIVE NEGATIVE    Comment: (NOTE) If result is NEGATIVE SARS-CoV-2 target nucleic acids are NOT DETECTED. The SARS-CoV-2 RNA is generally detectable in upper and lower  respiratory specimens during the acute phase of infection. The lowest  concentration of SARS-CoV-2 viral copies this assay can detect is 250  copies / mL. A negative result does not preclude SARS-CoV-2 infection  and should not be used as the sole basis for treatment or other  patient management decisions.  A negative result may occur with  improper specimen collection / handling, submission of specimen other  than nasopharyngeal swab, presence of viral mutation(s) within the  areas targeted by this assay, and inadequate number of viral copies  (<250 copies / mL). A negative result must be combined with clinical  observations, patient history, and epidemiological information. If result is POSITIVE SARS-CoV-2 target nucleic acids are DETECTED. The SARS-CoV-2 RNA is generally detectable in upper and lower  respiratory specimens dur ing the acute phase of infection.  Positive  results are indicative of active infection with SARS-CoV-2.  Clinical  correlation with patient history and other diagnostic information is  necessary to determine patient infection status.   Positive results do  not rule out bacterial infection or co-infection with other viruses. If result is PRESUMPTIVE POSTIVE SARS-CoV-2 nucleic acids MAY BE PRESENT.   A presumptive positive result was obtained on the submitted specimen  and confirmed on repeat testing.  While 2019 novel coronavirus  (SARS-CoV-2) nucleic acids may be present in the submitted sample  additional confirmatory testing may be necessary for epidemiological  and / or clinical management purposes  to differentiate between  SARS-CoV-2 and other Sarbecovirus currently known to infect humans.  If clinically indicated additional testing with an alternate test  methodology [phone]([id number]) is advised. The SARS-CoV-2 RNA is generally  detectable in upper and lower respiratory sp ecimens during the acute  phase of infection. The expected result is Negative. Fact Sheet for Patients:  BoilerBrush.com.cyhttps://www.fda.gov/media/136312/download Fact Sheet for Healthcare Providers: https://pope.com/https://www.fda.gov/media/136313/download This test is not yet approved or cleared by the [place] FDA and has been authorized for detection and/or diagnosis of SARS-CoV-2 by FDA under an Emergency Use Authorization (EUA).  This EUA will remain in effect (meaning this test can be used) for the duration of the COVID-19 declaration under Section 564(b)(1) of the Act, 21 U.S.C. section 360bbb-3(b)(1), unless the authorization is terminated or revoked sooner. Performed at [facility], [address] [place], [place] [address]   Magnesium     Status: None   Collection Time: 01/09/19  9:52 PM  Result Value Ref Range   Magnesium 2.0 1.7 - 2.4 mg/dL    Comment: Performed at [facility], [address] [place], [place] [address]  Comprehensive metabolic panel     Status: Abnormal   Collection Time: 01/10/19  4:24 AM  Result Value Ref Range   Sodium 146 (H) 135 - 145 mmol/L   Potassium 4.0 3.5 - 5.1 mmol/L    Comment: DELTA CHECK NOTED   Chloride 110 98 - 111  mmol/L   CO2 22 22 - 32 mmol/L   Glucose, Bld 76 70 - 99 mg/dL   BUN <5 (L) 6 - 20 mg/dL   Creatinine, Ser 3.080.47 0.44 - 1.00 mg/dL   Calcium 8.6 (L) 8.9 - 10.3 mg/dL   Total Protein 6.6 6.5 - 8.1 g/dL   Albumin 3.2 (L) 3.5 - 5.0 g/dL   AST 62 (H) 15 - 41 U/L   ALT 136 (H) 0 - 44 U/L   Alkaline Phosphatase 332 (H) 38 - 126 U/L   Total Bilirubin 3.7 (H) 0.3 - 1.2 mg/dL   GFR calc non Af Amer >60 >60 mL/min   GFR calc Af Amer >60 >60 mL/min   Anion gap 14 5 - 15    Comment: Performed at [facility], [address] [place], [place] [address]  CBC     Status: Abnormal   Collection Time: 01/10/19  4:24 AM  Result Value Ref Range   WBC 3.8 (L) 4.0 - 10.5 K/uL   RBC 4.35 3.87 - 5.11 MIL/uL   Hemoglobin 12.8 12.0 - 15.0 g/dL   HCT 69.639.8 29.536.0 - 28.446.0 %   MCV 91.5 80.0 - 100.0 fL   MCH 29.4 26.0 - 34.0 pg   MCHC 32.2 30.0 - 36.0 g/dL   RDW 13.213.8 44.011.5 - 10.215.5 %   Platelets 173 150 - 400 K/uL   nRBC 0.0 0.0 - 0.2 %    Comment: Performed at [facility], [address] [place], [place] [address]    Ct Abdomen W Contrast  Result Date: 01/09/2019 CLINICAL DATA:  Preprocedural evaluation prior to ERCP. EXAM: CT ABDOMEN WITH CONTRAST TECHNIQUE: Multidetector CT imaging of the abdomen was performed using the standard protocol following bolus administration of intravenous contrast. CONTRAST:  100mL OMNIPAQUE IOHEXOL 300 MG/ML  SOLN COMPARISON:  None. FINDINGS: Lower chest: Normal heart size. Dependent  atelectasis within the bilateral lower lobes. Hepatobiliary: The liver is normal in size and contour. No focal hepatic lesion is identified. Multiple stones within the gallbladder. There is gallbladder wall thickening and pericholecystic fat stranding. Common bile duct is dilated measuring up to 9 mm. Suggestion of enhancement of the wall of the common bile duct and central intrahepatic bile ducts. There are at least 2 stones identified within the mid to distal common bile duct each measuring approximately 5  mm (image 53; series 7). Pancreas: There is fat stranding about the pancreatic head/uncinate process. Spleen: Unremarkable Adrenals/Urinary Tract: Normal adrenal glands. Kidneys enhance symmetrically with contrast. No hydronephrosis. Stomach/Bowel: There is wall thickening of the second third portions of the duodenum, potentially reactive in etiology. Additionally, there is wall thickening of the hepatic flexure of the colon, adjacent to the gallbladder (image 36; series 2), potentially reactive in etiology. No free intraperitoneal air visualized small bowel is unremarkable. Vascular/Lymphatic: Normal caliber abdominal aorta. Circumaortic left renal vein. No retroperitoneal lymphadenopathy. Other: Small amount of fluid within the upper abdomen and pericolic gutters bilaterally. Musculoskeletal: No aggressive or acute appearing osseous lesions. IMPRESSION: 1. There are at least 2 stones within the common bile duct compatible with choledocholithiasis. Additionally, there is gallbladder wall thickening and surrounding fat stranding suggestive of acute cholecystitis. Apparent enhancement of the common bile duct wall and central intrahepatic bile ducts may represent cholangitis. 2. There is fat stranding and small amount of fluid about the pancreatic head and uncinate process which may represent associated pancreatitis. 3. There is wall thickening of the second and third portions of the duodenum as well as the hepatic flexure of the colon, favored to be reactive in etiology from the adjacent acute process. 4. These results will be called to the ordering clinician or representative by the Radiologist Assistant, and communication documented in the PACS or zVision Dashboard. Electronically Signed   By: Lovey Newcomer M.D.   On: 01/09/2019 17:45   US Abdomen Limited Ruq  Result Date: 01/09/2019 CLINICAL DATA:  Jaundice with right upper quadrant pain EXAM: ULTRASOUND ABDOMEN LIMITED RIGHT UPPER QUADRANT COMPARISON:  None.  FINDINGS: Gallbladder: Within the gallbladder, there are multiple echogenic foci which move and shadow consistent with cholelithiasis. Largest gallstone measures 1.1 cm in length. The gallbladder wall appears mildly thickened and slightly edematous. There is no pericholecystic fluid. No sonographic Murphy sign noted by sonographer. Common bile duct: Diameter: 9 mm which is prominent. Note that portions of the distal common bile duct are obscured by gas. There is questionable focus of air within the intrahepatic biliary ductal system. Liver: No focal lesion identified. Within normal limits in parenchymal echogenicity. Portal vein is patent on color Doppler imaging with normal direction of blood flow towards the liver. IMPRESSION: 1. Cholelithiasis with mild gallbladder wall thickening. Gallbladder wall appears slightly edematous. Suspect early acute cholecystitis. 2. Prominence of the common bile duct proximally. More distally, the common bile duct is obscured by gas. A distal calculus cannot be excluded given this circumstance. From an imaging standpoint, MRCP would be the imaging study of choice to further evaluate. There is a focal area of apparent air within the intrahepatic biliary ductal system. If patient has had previous sphincterotomy, air in the biliary ductal system could be explained on an iatrogenic basis. Given the other findings, early cholangitis is of concern given this finding. Electronically Signed   By: Lowella Grip III M.D.   On: 01/09/2019 14:13    ROS:  Pertinent items are noted in  HPI.  Blood pressure 117/69, pulse (!) 58, temperature 98.6 F (37 C), resp. rate 17, height 5\' 7"  (1.702 m), weight 88.9 kg, last menstrual period 12/30/2018, SpO2 97 %. Physical Exam: Pleasant well-developed and well-nourished white female no acute distress Head is normocephalic, atraumatic Eyes with mild scleral icterus Lungs are clear to auscultation with good breath sounds bilaterally Heart  examination reveals a regular rate and rhythm without S3, S4, murmurs Abdomen is soft with mild tenderness to palpation in the right upper quadrant.  No rigidity is noted.  CT scan and ultrasound reports reviewed.  Labs reviewed. Assessment/Plan: Impression: Gallstone pancreatitis, possible choledocholithiasis with cholangitis Plan: ERCP to be done today.  I have added the patient onto the schedule for Monday 01/13/2019 for laparoscopic cholecystectomy.  Will follow with you.  Franky MachoMark Mirela Parsley 01/10/2019, 7:30 AM

## 2019-01-10 NOTE — Anesthesia Postprocedure Evaluation (Signed)
Anesthesia Post Note  Patient: Crystallee Werden  Procedure(s) Performed: ENDOSCOPIC RETROGRADE CHOLANGIOPANCREATOGRAPHY (ERCP) (N/A ) SPHINCTEROTOMY (N/A ) BALLOON DILATATION AND STONE EXTRACTION (N/A )  Patient location during evaluation: PACU Anesthesia Type: General Level of consciousness: awake and alert and oriented Pain management: pain level controlled Vital Signs Assessment: post-procedure vital signs reviewed and stable Respiratory status: spontaneous breathing Cardiovascular status: blood pressure returned to baseline Postop Assessment: no apparent nausea or vomiting Anesthetic complications: no     Last Vitals:  Vitals:   01/10/19 1418 01/10/19 1419  BP: 130/83   Pulse: 89   Resp: 17   Temp: 36.5 C 36.5 C  SpO2: 95%     Last Pain:  Vitals:   01/10/19 1418  TempSrc:   PainSc: 0-No pain                 Rica Heather

## 2019-01-10 NOTE — Anesthesia Procedure Notes (Signed)
Procedure Name: Intubation Date/Time: 01/10/2019 1:19 PM Performed by: Ollen Bowl, CRNA Pre-anesthesia Checklist: Patient identified, Patient being monitored, Timeout performed, Emergency Drugs available and Suction available Patient Re-evaluated:Patient Re-evaluated prior to induction Oxygen Delivery Method: Circle system utilized Preoxygenation: Pre-oxygenation with 100% oxygen Induction Type: IV induction Ventilation: Mask ventilation without difficulty Laryngoscope Size: Mac and 3 Grade View: Grade I Tube type: Oral Tube size: 7.0 mm Number of attempts: 1 Airway Equipment and Method: Stylet Placement Confirmation: ETT inserted through vocal cords under direct vision,  positive ETCO2 and breath sounds checked- equal and bilateral Secured at: 21 cm Tube secured with: Tape Dental Injury: Teeth and Oropharynx as per pre-operative assessment

## 2019-01-10 NOTE — Progress Notes (Signed)
PROGRESS NOTE    Sonya Simpsonatricia Harriss  ZOX:[id number] DOB: Aug 04, 1990 DOA: 01/09/2019 PCP: Patient, No Pcp Per   Brief Narrative:  Per HPI: Sonya Mcdonald is a 28 y.o. female with no significant past medical history who presented to the emergency department with sudden onset right upper quadrant abdominal pain along with some nausea and one episode of emesis that began yesterday.  She denies any fevers or chills, nor has she had any hematemesis, or diarrhea.  She denies any sick contacts.  She does state that her skin has been turning more yellow in color as well.  She states that the pain is dull and achy in nature and does not seem to radiate.  She denies any aggravating or alleviating factors.  She has never had pain similar to this previously.  She states that she only drinks alcohol socially and on occasion.  She denies any tobacco use.  Patient was admitted with choledocholithiasis resulting in jaundice along with mild biliary pancreatitis as well as calculus cholecystitis.  Assessment & Plan:   Active Problems:   Acute cholecystitis   Acute choledocholithiasis with jaundice and mild biliary pancreatitis -ERCP with sphincterotomy and stone extraction per GI today -Repeat a.m. labs along with CBC -Continue IV fluid and pain medications -Zofran as needed for nausea and vomiting -Advance diet as tolerated  Acute calculus cholecystitis -Plans for cholecystectomy on 7/20 per general surgery   DVT prophylaxis: Lovenox Code Status: Full Family Communication: None at bedside Disposition Plan: ERCP planned for today with cholecystectomy on 7/20.  Appreciate GI and general surgery recommendations   Consultants:   GI  General surgery  Procedures:   ERCP with sphincterotomy and stone extraction 7/17  Antimicrobials:  Anti-infectives (From admission, onward)   Start     Dose/Rate Route Frequency Ordered Stop   01/09/19 2200  [MAR Hold]  piperacillin-tazobactam (ZOSYN) IVPB  3.375 g     (MAR Hold since Fri 01/10/2019 at 1200.Hold Reason: Transfer to a Procedural area.)   3.375 g 12.5 mL/hr over 240 Minutes Intravenous Every 8 hours 01/09/19 1553     01/09/19 1430  piperacillin-tazobactam (ZOSYN) IVPB 3.375 g     3.375 g 100 mL/hr over 30 Minutes Intravenous  Once 01/09/19 1425 01/09/19 1510       Subjective: Patient seen and evaluated today with no new acute complaints or concerns. No acute concerns or events noted overnight.  She states her abdominal pain is improved with IV narcotics, but she does have some itching.  No nausea or vomiting noted.  She continues to have dark urine noted.  Objective: Vitals:   01/09/19 1824 01/09/19 1943 01/10/19 0507 01/10/19 1226  BP: 117/83 129/84 117/69 117/80  Pulse: 71 67 (!) 58 77  Resp: 18 17 17 16   Temp:  97.8 F (36.6 C) 98.6 F (37 C) 97.8 F (36.6 C)  TempSrc:  Oral    SpO2: 100% 100% 97% 97%  Weight:      Height:        Intake/Output Summary (Last 24 hours) at 01/10/2019 1234 Last data filed at 01/10/2019 0300 Gross per 24 hour  Intake 2202.07 ml  Output --  Net 2202.07 ml   Filed Weights   01/09/19 1205  Weight: 88.9 kg    Examination:  General exam: Appears calm and comfortable  Respiratory system: Clear to auscultation. Respiratory effort normal. Cardiovascular system: S1 & S2 heard, RRR. No JVD, murmurs, rubs, gallops or clicks. No pedal edema. Gastrointestinal system: Abdomen is nondistended, soft  and nontender. No organomegaly or masses felt. Normal bowel sounds heard. Central nervous system: Alert and oriented. No focal neurological deficits. Extremities: Symmetric 5 x 5 power. Skin: No rashes, lesions or ulcers Psychiatry: Judgement and insight appear normal. Mood & affect appropriate.     Data Reviewed: I have personally reviewed following labs and imaging studies  CBC: Recent Labs  Lab 01/09/19 1309 01/10/19 0424  WBC 5.9 3.8*  NEUTROABS 4.2  --   HGB 14.2 12.8  HCT 43.6  39.8  MCV 91.0 91.5  PLT 193 671   Basic Metabolic Panel: Recent Labs  Lab 01/09/19 1309 01/09/19 2152 01/10/19 0424  NA 138  --  146*  K 3.2*  --  4.0  CL 104  --  110  CO2 23  --  22  GLUCOSE 98  --  76  BUN 5*  --  <5*  CREATININE 0.48  --  0.47  CALCIUM 9.0  --  8.6*  MG  --  2.0  --    GFR: Estimated Creatinine Clearance: 119.8 mL/min (by C-G formula based on SCr of 0.47 mg/dL). Liver Function Tests: Recent Labs  Lab 01/09/19 1309 01/10/19 0424  AST 103* 62*  ALT 197* 136*  ALKPHOS 434* 332*  BILITOT 5.5* 3.7*  PROT 7.4 6.6  ALBUMIN 3.8 3.2*   Recent Labs  Lab 01/09/19 1309 01/10/19 0424  LIPASE 775* 289*   No results for input(s): AMMONIA in the last 168 hours. Coagulation Profile: No results for input(s): INR, PROTIME in the last 168 hours. Cardiac Enzymes: No results for input(s): CKTOTAL, CKMB, CKMBINDEX, TROPONINI in the last 168 hours. BNP (last 3 results) No results for input(s): PROBNP in the last 8760 hours. HbA1C: No results for input(s): HGBA1C in the last 72 hours. CBG: No results for input(s): GLUCAP in the last 168 hours. Lipid Profile: No results for input(s): CHOL, HDL, LDLCALC, TRIG, CHOLHDL, LDLDIRECT in the last 72 hours. Thyroid Function Tests: No results for input(s): TSH, T4TOTAL, FREET4, T3FREE, THYROIDAB in the last 72 hours. Anemia Panel: No results for input(s): VITAMINB12, FOLATE, FERRITIN, TIBC, IRON, RETICCTPCT in the last 72 hours. Sepsis Labs: No results for input(s): PROCALCITON, LATICACIDVEN in the last 168 hours.  Recent Results (from the past 240 hour(s))  SARS Coronavirus 2 (CEPHEID - Performed in [place] hospital lab), Hosp Order     Status: None   Collection Time: 01/09/19  2:38 PM   Specimen: Nasopharyngeal Swab  Result Value Ref Range Status   SARS Coronavirus 2 NEGATIVE NEGATIVE Final    Comment: (NOTE) If result is NEGATIVE SARS-CoV-2 target nucleic acids are NOT DETECTED. The SARS-CoV-2 RNA is  generally detectable in upper and lower  respiratory specimens during the acute phase of infection. The lowest  concentration of SARS-CoV-2 viral copies this assay can detect is 250  copies / mL. A negative result does not preclude SARS-CoV-2 infection  and should not be used as the sole basis for treatment or other  patient management decisions.  A negative result may occur with  improper specimen collection / handling, submission of specimen other  than nasopharyngeal swab, presence of viral mutation(s) within the  areas targeted by this assay, and inadequate number of viral copies  (<250 copies / mL). A negative result must be combined with clinical  observations, patient history, and epidemiological information. If result is POSITIVE SARS-CoV-2 target nucleic acids are DETECTED. The SARS-CoV-2 RNA is generally detectable in upper and lower  respiratory specimens dur ing  the acute phase of infection.  Positive  results are indicative of active infection with SARS-CoV-2.  Clinical  correlation with patient history and other diagnostic information is  necessary to determine patient infection status.  Positive results do  not rule out bacterial infection or co-infection with other viruses. If result is PRESUMPTIVE POSTIVE SARS-CoV-2 nucleic acids MAY BE PRESENT.   A presumptive positive result was obtained on the submitted specimen  and confirmed on repeat testing.  While 2019 novel coronavirus  (SARS-CoV-2) nucleic acids may be present in the submitted sample  additional confirmatory testing may be necessary for epidemiological  and / or clinical management purposes  to differentiate between  SARS-CoV-2 and other Sarbecovirus currently known to infect humans.  If clinically indicated additional testing with an alternate test  methodology [phone] is advised. The SARS-CoV-2 RNA is generally  detectable in upper and lower respiratory sp ecimens during the acute  phase of  infection. The expected result is Negative. Fact Sheet for Patients:  BoilerBrush.com.cyhttps://www.fda.gov/media/136312/download Fact Sheet for Healthcare Providers: https://pope.com/https://www.fda.gov/media/136313/download This test is not yet approved or cleared by the [place] FDA and has been authorized for detection and/or diagnosis of SARS-CoV-2 by FDA under an Emergency Use Authorization (EUA).  This EUA will remain in effect (meaning this test can be used) for the duration of the COVID-19 declaration under Section 564(b)(1) of the Act, 21 U.S.C. section 360bbb-3(b)(1), unless the authorization is terminated or revoked sooner. Performed at [facility], [address] [place], [place] [address]          Radiology Studies: Ct Abdomen W Contrast  Result Date: 01/09/2019 CLINICAL DATA:  Preprocedural evaluation prior to ERCP. EXAM: CT ABDOMEN WITH CONTRAST TECHNIQUE: Multidetector CT imaging of the abdomen was performed using the standard protocol following bolus administration of intravenous contrast. CONTRAST:  100mL OMNIPAQUE IOHEXOL 300 MG/ML  SOLN COMPARISON:  None. FINDINGS: Lower chest: Normal heart size. Dependent atelectasis within the bilateral lower lobes. Hepatobiliary: The liver is normal in size and contour. No focal hepatic lesion is identified. Multiple stones within the gallbladder. There is gallbladder wall thickening and pericholecystic fat stranding. Common bile duct is dilated measuring up to 9 mm. Suggestion of enhancement of the wall of the common bile duct and central intrahepatic bile ducts. There are at least 2 stones identified within the mid to distal common bile duct each measuring approximately 5 mm (image 53; series 7). Pancreas: There is fat stranding about the pancreatic head/uncinate process. Spleen: Unremarkable Adrenals/Urinary Tract: Normal adrenal glands. Kidneys enhance symmetrically with contrast. No hydronephrosis. Stomach/Bowel: There is wall thickening of the second third  portions of the duodenum, potentially reactive in etiology. Additionally, there is wall thickening of the hepatic flexure of the colon, adjacent to the gallbladder (image 36; series 2), potentially reactive in etiology. No free intraperitoneal air visualized small bowel is unremarkable. Vascular/Lymphatic: Normal caliber abdominal aorta. Circumaortic left renal vein. No retroperitoneal lymphadenopathy. Other: Small amount of fluid within the upper abdomen and pericolic gutters bilaterally. Musculoskeletal: No aggressive or acute appearing osseous lesions. IMPRESSION: 1. There are at least 2 stones within the common bile duct compatible with choledocholithiasis. Additionally, there is gallbladder wall thickening and surrounding fat stranding suggestive of acute cholecystitis. Apparent enhancement of the common bile duct wall and central intrahepatic bile ducts may represent cholangitis. 2. There is fat stranding and small amount of fluid about the pancreatic head and uncinate process which may represent associated pancreatitis. 3. There is wall thickening of the second and third  portions of the duodenum as well as the hepatic flexure of the colon, favored to be reactive in etiology from the adjacent acute process. 4. These results will be called to the ordering clinician or representative by the Radiologist Assistant, and communication documented in the PACS or zVision Dashboard. Electronically Signed   By: Annia Beltrew  Davis M.D.   On: 01/09/2019 17:45   [place] Abdomen Limited Ruq  Result Date: 01/09/2019 CLINICAL DATA:  Jaundice with right upper quadrant pain EXAM: ULTRASOUND ABDOMEN LIMITED RIGHT UPPER QUADRANT COMPARISON:  None. FINDINGS: Gallbladder: Within the gallbladder, there are multiple echogenic foci which move and shadow consistent with cholelithiasis. Largest gallstone measures 1.1 cm in length. The gallbladder wall appears mildly thickened and slightly edematous. There is no pericholecystic fluid. No  sonographic Murphy sign noted by sonographer. Common bile duct: Diameter: 9 mm which is prominent. Note that portions of the distal common bile duct are obscured by gas. There is questionable focus of air within the intrahepatic biliary ductal system. Liver: No focal lesion identified. Within normal limits in parenchymal echogenicity. Portal vein is patent on color Doppler imaging with normal direction of blood flow towards the liver. IMPRESSION: 1. Cholelithiasis with mild gallbladder wall thickening. Gallbladder wall appears slightly edematous. Suspect early acute cholecystitis. 2. Prominence of the common bile duct proximally. More distally, the common bile duct is obscured by gas. A distal calculus cannot be excluded given this circumstance. From an imaging standpoint, MRCP would be the imaging study of choice to further evaluate. There is a focal area of apparent air within the intrahepatic biliary ductal system. If patient has had previous sphincterotomy, air in the biliary ductal system could be explained on an iatrogenic basis. Given the other findings, early cholangitis is of concern given this finding. Electronically Signed   By: Bretta BangWilliam  Woodruff III M.D.   On: 01/09/2019 14:13        Scheduled Meds:  [MAR Hold] enoxaparin (LOVENOX) injection  40 mg Subcutaneous Q24H   [MAR Hold]  HYDROmorphone (DILAUDID) injection  0.5 mg Intravenous Q6H   [MAR Hold] ondansetron (ZOFRAN) IV  4 mg Intravenous Q6H   Continuous Infusions:  lactated ringers 125 mL/hr at 01/10/19 0929   lactated ringers     [MAR Hold] piperacillin-tazobactam (ZOSYN)  IV 3.375 g (01/10/19 0636)     LOS: 1 day    Time spent: 30 minutes    Shanina Kepple Hoover Brunette Sera Hitsman, DO [facility] Pager [phone]  If 7PM-7AM, please contact night-coverage [email or web address] Password [email or web address] 01/10/2019, 12:34 PM

## 2019-01-10 NOTE — Progress Notes (Signed)
  Brief ERCP note.  Normal limited view of the esophagus and stomach. Normal ampulla of Vater. Mildly dilated CBD with 2 filling defects system with stones.  1 stone triangular. Cystic duct patent with filling of the gallbladder revealing multiple stones. Very sphincterotomy performed. Probably dilated to 8 mm with balloon dilator. Stones removed using Dormia basket. PD was not cannulated or filled with contrast. Patient tolerated procedure well.

## 2019-01-10 NOTE — Op Note (Signed)
[facility] Patient Name: Rasheka Denard Procedure Date: 01/10/2019 12:32 PM MRN: [id number] Date of Birth: [birth year] Attending MD: Hildred Laser , MD CSN: [id number] Age: 28 Admit Type: Inpatient Procedure:                ERCP Indications:              For therapy of bile duct stone(s) Providers:                Hildred Laser, MD, Otis Peak B. Sharon Seller, RN, Nelma Rothman, Technician Referring MD:             Heath Lark, DO Medicines:                General Anesthesia Complications:            No immediate complications. Estimated Blood Loss:     Estimated blood loss: none. Procedure:                Pre-Anesthesia Assessment:                           - Prior to the procedure, a History and Physical                            was performed, and patient medications and                            allergies were reviewed. The patient's tolerance of                            previous anesthesia was also reviewed. The risks                            and benefits of the procedure and the sedation                            options and risks were discussed with the patient.                            All questions were answered, and informed consent                            was obtained. Prior Anticoagulants: The patient has                            taken no previous anticoagulant or antiplatelet                            agents. ASA Grade Assessment: I - A normal, healthy                            patient. After reviewing the risks and benefits,  the patient was deemed in satisfactory condition to                            undergo the procedure.                           After obtaining informed consent, the scope was                            passed under direct vision. Throughout the                            procedure, the patient's blood pressure, pulse, and                            oxygen saturations were monitored  continuously. The                            [id number]-[id number] ([id number] scope was introduced through                            the mouth, and used to inject contrast into and                            used to inject contrast into the bile duct. The                            ERCP was accomplished without difficulty. The                            patient tolerated the procedure well. Scope In: 1:33:57 PM Scope Out: 2:01:15 PM Total Procedure Duration: 0 hours 27 minutes 18 seconds  Findings:      The scout film was normal. The esophagus was successfully intubated       under direct vision. The scope was advanced to a normal major papilla in       the descending duodenum without detailed examination of the pharynx,       larynx and associated structures, and upper GI tract. The upper GI tract       was grossly normal. The bile duct was deeply cannulated with the       Hydratome sphincterotome. Contrast was injected. I personally       interpreted the bile duct images. Ductal flow of contrast was adequate.       Image quality was adequate. Contrast extended to the entire biliary       tree. The main bile duct and common bile duct were mildly dilated and       diffusely dilated, with a stone causing an obstruction. The largest       diameter was 8 mm. Intrahepatic ducts normal.      GB full of stones. Biliary sphincterotomy was made with a braided       Hydratome sphincterotome using ERBE electrocautery. There was no       post-sphincterotomy bleeding. Dilation of major papilla with an 02-01-09       mm balloon (to a maximum balloon size of 8 mm)  dilator was successful.       The biliary tree was swept with a basket starting at the bifurcation.       Sludge was swept from the duct. Two stones were removed. No stones       remained. Impression:               - The entire main bile duct and common bile duct                            were mildly dilated, with a stone causing an                             obstruction.                           - Choledocholithiasis was found. Complete removal                            was accomplished by biliary sphincterotomy and                            basket extraction.                           - A biliary sphincterotomy was performed.                           - Major papilla was successfully dilated.                           - The biliary tree was swept. Moderate Sedation:      Per Anesthesia Care Recommendation:           - Return patient to hospital ward for ongoing care.                           - Clear liquid diet today.                           - Continue present medications.                           - Check lipase, liver enzymes (AST, ALT, alkaline                            phosphatase, bilirubin) and hemogram with white                            blood cell count and platelets in the morning.                           - Avoid aspirin and nonsteroidal anti-inflammatory                            medicines for 3 days.                           -  Continue present medications.                           - Cholecystectomy on 01/13/19. Procedure Code(s):        --- Professional ---                           [phone], Endoscopic retrograde                            cholangiopancreatography (ERCP); with removal of                            calculi/debris from biliary/pancreatic duct(s) Diagnosis Code(s):        --- Professional ---                           K80.51, Calculus of bile duct without cholangitis                            or cholecystitis with obstruction CPT copyright 2019 [employer]. All rights reserved. The codes documented in this report are preliminary and upon coder review may  be revised to meet current compliance requirements. Lionel DecemberNajeeb Rehman, MD Lionel DecemberNajeeb Rehman, MD 01/10/2019 2:36:01 PM This report has been signed electronically. Number of Addenda: 0

## 2019-01-11 LAB — CBC
HCT: 41.4 % (ref 36.0–46.0)
Hemoglobin: 13.5 g/dL (ref 12.0–15.0)
MCH: 29.3 pg (ref 26.0–34.0)
MCHC: 32.6 g/dL (ref 30.0–36.0)
MCV: 90 fL (ref 80.0–100.0)
Platelets: 192 10*3/uL (ref 150–400)
RBC: 4.6 MIL/uL (ref 3.87–5.11)
RDW: 13.3 % (ref 11.5–15.5)
WBC: 3.6 10*3/uL — ABNORMAL LOW (ref 4.0–10.5)
nRBC: 0 % (ref 0.0–0.2)

## 2019-01-11 LAB — COMPREHENSIVE METABOLIC PANEL
ALT: 120 U/L — ABNORMAL HIGH (ref 0–44)
AST: 68 U/L — ABNORMAL HIGH (ref 15–41)
Albumin: 3.2 g/dL — ABNORMAL LOW (ref 3.5–5.0)
Alkaline Phosphatase: 295 U/L — ABNORMAL HIGH (ref 38–126)
Anion gap: 10 (ref 5–15)
BUN: <5 mg/dL — ABNORMAL LOW (ref 6–20)
CO2: 25 mmol/L (ref 22–32)
Calcium: 9.3 mg/dL (ref 8.9–10.3)
Chloride: 104 mmol/L (ref 98–111)
Creatinine, Ser: 0.52 mg/dL (ref 0.44–1.00)
GFR calc Af Amer: >60 mL/min (ref >60)
GFR calc non Af Amer: >60 mL/min (ref >60)
Glucose, Bld: 98 mg/dL (ref 70–99)
Potassium: 3.6 mmol/L (ref 3.5–5.1)
Sodium: 139 mmol/L (ref 135–145)
Total Bilirubin: 2.9 mg/dL — ABNORMAL HIGH (ref 0.3–1.2)
Total Protein: 6.8 g/dL (ref 6.5–8.1)

## 2019-01-11 LAB — HIV ANTIBODY (ROUTINE TESTING W REFLEX): HIV Screen 4th Generation wRfx: NONREACTIVE

## 2019-01-11 NOTE — Progress Notes (Signed)
PROGRESS NOTE    Sonya Mcdonald  WUJ:[id number] DOB: 06/19/1991 DOA: 01/09/2019 PCP: Patient, No Pcp Per   Brief Narrative:  Per HPI: Sonya Mcdonald a 28 y.o.femalewithno significant past medical history who presented to the emergency department with sudden onset right upper quadrant abdominal pain along with some nausea and one episode of emesis that began yesterday. She denies any fevers or chills, nor has she had any hematemesis, or diarrhea. She denies any sick contacts. She does state that her skin has been turning more yellow in color as well. She states that the pain is dull and achy in nature and does not seem to radiate. She denies any aggravating or alleviating factors. She has never had pain similar to this previously. She states that she only drinks alcohol socially and on occasion. She denies any tobacco use.  Patient was admitted with choledocholithiasis resulting in jaundice along with mild biliary pancreatitis as well as calculus cholecystitis.  She is status post ERCP with sphincterotomy and balloon dilation and stone extraction which was performed on 7/17.  She is now awaiting laparoscopic cholecystectomy planned for 7/20.   Assessment & Plan:   Active Problems:   Acute cholecystitis  Acute choledocholithiasis with jaundice and mild biliary pancreatitis status post ERCP with balloon dilation and stone extraction on 7/17 -Awaiting laparoscopic cholecystectomy on 7/20 per general surgery -Repeat a.m. labs along with CBC -Continue IV fluid at lower rate and pain medications as needed -Zofran as needed for nausea and vomiting -Advance diet as tolerated per GI  Acute calculus cholecystitis -Plans for cholecystectomy on 7/20 per general surgery   DVT prophylaxis: Lovenox Code Status: Full Family Communication: None at bedside Disposition Plan: ERCP on 7/17 with cholecystectomy planned for 7/20.  Appreciate further GI and general surgery  recommendations.   Consultants:   GI  General surgery  Procedures:   ERCP with sphincterotomy and stone extraction 7/17  Antimicrobials:  Anti-infectives (From admission, onward)   Start     Dose/Rate Route Frequency Ordered Stop   01/09/19 2200  piperacillin-tazobactam (ZOSYN) IVPB 3.375 g     3.375 g 12.5 mL/hr over 240 Minutes Intravenous Every 8 hours 01/09/19 1553     01/09/19 1430  piperacillin-tazobactam (ZOSYN) IVPB 3.375 g     3.375 g 100 mL/hr over 30 Minutes Intravenous  Once 01/09/19 1425 01/09/19 1510       Subjective: Patient seen and evaluated today with no new acute complaints or concerns. No acute concerns or events noted overnight.  She denies any abdominal pain, nausea, or vomiting and is tolerating her diet.  She states that her urine is lighter as well.  Objective: Vitals:   01/10/19 1528 01/10/19 1949 01/10/19 2140 01/11/19 0522  BP: 137/87  128/79 132/70  Pulse: 74 71 62 61  Resp: Temp:   98.3 F (36.8 C) 98.7 F (37.1 C)  TempSrc:   Oral   SpO2: 96% 94% 100% 100%  Weight:      Height:        Intake/Output Summary (Last 24 hours) at 01/11/2019 1008 Last data filed at 01/11/2019 0500 Gross per 24 hour  Intake 1200 ml  Output 200 ml  Net 1000 ml   Filed Weights   01/09/19 1205  Weight: 88.9 kg    Examination:  General exam: Appears calm and comfortable  Respiratory system: Clear to auscultation. Respiratory effort normal. Cardiovascular system: S1 & S2 heard, RRR. No JVD, murmurs, rubs, gallops or clicks. No pedal  edema. Gastrointestinal system: Abdomen is nondistended, soft and nontender. No organomegaly or masses felt. Normal bowel sounds heard. Central nervous system: Alert and oriented. No focal neurological deficits. Extremities: Symmetric 5 x 5 power. Skin: No rashes, lesions or ulcers Psychiatry: Judgement and insight appear normal. Mood & affect appropriate.     Data Reviewed: I have personally reviewed  following labs and imaging studies  CBC: Recent Labs  Lab 01/09/19 1309 01/10/19 0424 01/11/19 0549  WBC 5.9 3.8* 3.6*  NEUTROABS 4.2  --   --   HGB 14.2 12.8 13.5  HCT 43.6 39.8 41.4  MCV 91.0 91.5 90.0  PLT 193 173 409   Basic Metabolic Panel: Recent Labs  Lab 01/09/19 1309 01/09/19 2152 01/10/19 0424 01/11/19 0549  NA 138  --  146* 139  K 3.2*  --  4.0 3.6  CL 104  --  110 104  CO2 23  --  22 25  GLUCOSE 98  --  76 98  BUN 5*  --  <5* <5*  CREATININE 0.48  --  0.47 0.52  CALCIUM 9.0  --  8.6* 9.3  MG  --  2.0  --   --    GFR: Estimated Creatinine Clearance: 119.8 mL/min (by C-G formula based on SCr of 0.52 mg/dL). Liver Function Tests: Recent Labs  Lab 01/09/19 1309 01/10/19 0424 01/11/19 0549  AST 103* 62* 68*  ALT 197* 136* 120*  ALKPHOS 434* 332* 295*  BILITOT 5.5* 3.7* 2.9*  PROT 7.4 6.6 6.8  ALBUMIN 3.8 3.2* 3.2*   Recent Labs  Lab 01/09/19 1309 01/10/19 0424 01/10/19 1510  LIPASE 775* 289* 112*   No results for input(s): AMMONIA in the last 168 hours. Coagulation Profile: No results for input(s): INR, PROTIME in the last 168 hours. Cardiac Enzymes: No results for input(s): CKTOTAL, CKMB, CKMBINDEX, TROPONINI in the last 168 hours. BNP (last 3 results) No results for input(s): PROBNP in the last 8760 hours. HbA1C: No results for input(s): HGBA1C in the last 72 hours. CBG: No results for input(s): GLUCAP in the last 168 hours. Lipid Profile: No results for input(s): CHOL, HDL, LDLCALC, TRIG, CHOLHDL, LDLDIRECT in the last 72 hours. Thyroid Function Tests: No results for input(s): TSH, T4TOTAL, FREET4, T3FREE, THYROIDAB in the last 72 hours. Anemia Panel: No results for input(s): VITAMINB12, FOLATE, FERRITIN, TIBC, IRON, RETICCTPCT in the last 72 hours. Sepsis Labs: No results for input(s): PROCALCITON, LATICACIDVEN in the last 168 hours.  Recent Results (from the past 240 hour(s))  Urine culture     Status: None   Collection Time:  01/09/19  1:13 PM   Specimen: Urine, Clean Catch  Result Value Ref Range Status   Specimen Description   Final    URINE, CLEAN CATCH Performed at [facility], [address] [place], [place] [address]    Special Requests   Final    NONE Performed at [facility], [address], [place], [place] [address]    Culture   Final    NO GROWTH Performed at [facility], [place] [address] [place], [place] [address]    Report Status 01/10/2019 FINAL  Final  SARS Coronavirus 2 (CEPHEID - Performed in [place] hospital lab), Hosp Order     Status: None   Collection Time: 01/09/19  2:38 PM   Specimen: Nasopharyngeal Swab  Result Value Ref Range Status   SARS Coronavirus 2 NEGATIVE NEGATIVE Final    Comment: (NOTE) If result is NEGATIVE SARS-CoV-2 target nucleic  acids are NOT DETECTED. The SARS-CoV-2 RNA is generally detectable in upper and lower  respiratory specimens during the acute phase of infection. The lowest  concentration of SARS-CoV-2 viral copies this assay can detect is 250  copies / mL. A negative result does not preclude SARS-CoV-2 infection  and should not be used as the sole basis for treatment or other  patient management decisions.  A negative result may occur with  improper specimen collection / handling, submission of specimen other  than nasopharyngeal swab, presence of viral mutation(s) within the  areas targeted by this assay, and inadequate number of viral copies  (<250 copies / mL). A negative result must be combined with clinical  observations, patient history, and epidemiological information. If result is POSITIVE SARS-CoV-2 target nucleic acids are DETECTED. The SARS-CoV-2 RNA is generally detectable in upper and lower  respiratory specimens dur ing the acute phase of infection.  Positive  results are indicative of active infection with SARS-CoV-2.  Clinical  correlation with patient history and other diagnostic information is  necessary to determine  patient infection status.  Positive results do  not rule out bacterial infection or co-infection with other viruses. If result is PRESUMPTIVE POSTIVE SARS-CoV-2 nucleic acids MAY BE PRESENT.   A presumptive positive result was obtained on the submitted specimen  and confirmed on repeat testing.  While 2019 novel coronavirus  (SARS-CoV-2) nucleic acids may be present in the submitted sample  additional confirmatory testing may be necessary for epidemiological  and / or clinical management purposes  to differentiate between  SARS-CoV-2 and other Sarbecovirus currently known to infect humans.  If clinically indicated additional testing with an alternate test  methodology [phone]([id number]) is advised. The SARS-CoV-2 RNA is generally  detectable in upper and lower respiratory sp ecimens during the acute  phase of infection. The expected result is Negative. Fact Sheet for Patients:  BoilerBrush.com.cyhttps://www.fda.gov/media/136312/download Fact Sheet for Healthcare Providers: https://pope.com/https://www.fda.gov/media/136313/download This test is not yet approved or cleared by the [place] FDA and has been authorized for detection and/or diagnosis of SARS-CoV-2 by FDA under an Emergency Use Authorization (EUA).  This EUA will remain in effect (meaning this test can be used) for the duration of the COVID-19 declaration under Section 564(b)(1) of the Act, 21 U.S.C. section 360bbb-3(b)(1), unless the authorization is terminated or revoked sooner. Performed at [facility], [address] [place], [place] [id number]          Radiology Studies: Ct Abdomen W Contrast  Result Date: 01/09/2019 CLINICAL DATA:  Preprocedural evaluation prior to ERCP. EXAM: CT ABDOMEN WITH CONTRAST TECHNIQUE: Multidetector CT imaging of the abdomen was performed using the standard protocol following bolus administration of intravenous contrast. CONTRAST:  100mL OMNIPAQUE IOHEXOL 300 MG/ML  SOLN COMPARISON:  None. FINDINGS: Lower chest: Normal heart  size. Dependent atelectasis within the bilateral lower lobes. Hepatobiliary: The liver is normal in size and contour. No focal hepatic lesion is identified. Multiple stones within the gallbladder. There is gallbladder wall thickening and pericholecystic fat stranding. Common bile duct is dilated measuring up to 9 mm. Suggestion of enhancement of the wall of the common bile duct and central intrahepatic bile ducts. There are at least 2 stones identified within the mid to distal common bile duct each measuring approximately 5 mm (image 53; series 7). Pancreas: There is fat stranding about the pancreatic head/uncinate process. Spleen: Unremarkable Adrenals/Urinary Tract: Normal adrenal glands. Kidneys enhance symmetrically with contrast. No hydronephrosis. Stomach/Bowel: There is wall thickening of the second third portions  of the duodenum, potentially reactive in etiology. Additionally, there is wall thickening of the hepatic flexure of the colon, adjacent to the gallbladder (image 36; series 2), potentially reactive in etiology. No free intraperitoneal air visualized small bowel is unremarkable. Vascular/Lymphatic: Normal caliber abdominal aorta. Circumaortic left renal vein. No retroperitoneal lymphadenopathy. Other: Small amount of fluid within the upper abdomen and pericolic gutters bilaterally. Musculoskeletal: No aggressive or acute appearing osseous lesions. IMPRESSION: 1. There are at least 2 stones within the common bile duct compatible with choledocholithiasis. Additionally, there is gallbladder wall thickening and surrounding fat stranding suggestive of acute cholecystitis. Apparent enhancement of the common bile duct wall and central intrahepatic bile ducts may represent cholangitis. 2. There is fat stranding and small amount of fluid about the pancreatic head and uncinate process which may represent associated pancreatitis. 3. There is wall thickening of the second and third portions of the duodenum as  well as the hepatic flexure of the colon, favored to be reactive in etiology from the adjacent acute process. 4. These results will be called to the ordering clinician or representative by the Radiologist Assistant, and communication documented in the PACS or zVision Dashboard. Electronically Signed   By: Annia Beltrew  Davis M.D.   On: 01/09/2019 17:45   Dg Ercp With Sphincterotomy  Result Date: 01/10/2019 CLINICAL DATA:  Choledocholithiasis EXAM: ERCP TECHNIQUE: Multiple spot images obtained with the fluoroscopic device and submitted for interpretation post-procedure. COMPARISON:  CT from the previous day FINDINGS: Series of fluoroscopic runs and images document endoscopic cannulation and opacification of the CBD. At least 2 multifaceted calculi in the distal CBD, near occlusive. Innumerable faceted calculi are noted in the lumen of the gallbladder. Cystic duct is patent. Incomplete opacification/visualization of the intrahepatic biliary tree, which appears decompressed centrally. Images document subsequent basket passage through the CBD with clearance of evident calculi. No extravasation. IMPRESSION: 1. Choledocholithiasis with endoscopic CBD cannulation and intervention. 2. Cholelithiasis. These images were submitted for radiologic interpretation only. Please see the procedural report for the amount of contrast and the fluoroscopy time utilized. Electronically Signed   By: Corlis Leak  Hassell M.D.   On: 01/10/2019 14:42   [place] Abdomen Limited Ruq  Result Date: 01/09/2019 CLINICAL DATA:  Jaundice with right upper quadrant pain EXAM: ULTRASOUND ABDOMEN LIMITED RIGHT UPPER QUADRANT COMPARISON:  None. FINDINGS: Gallbladder: Within the gallbladder, there are multiple echogenic foci which move and shadow consistent with cholelithiasis. Largest gallstone measures 1.1 cm in length. The gallbladder wall appears mildly thickened and slightly edematous. There is no pericholecystic fluid. No sonographic Murphy sign noted by  sonographer. Common bile duct: Diameter: 9 mm which is prominent. Note that portions of the distal common bile duct are obscured by gas. There is questionable focus of air within the intrahepatic biliary ductal system. Liver: No focal lesion identified. Within normal limits in parenchymal echogenicity. Portal vein is patent on color Doppler imaging with normal direction of blood flow towards the liver. IMPRESSION: 1. Cholelithiasis with mild gallbladder wall thickening. Gallbladder wall appears slightly edematous. Suspect early acute cholecystitis. 2. Prominence of the common bile duct proximally. More distally, the common bile duct is obscured by gas. A distal calculus cannot be excluded given this circumstance. From an imaging standpoint, MRCP would be the imaging study of choice to further evaluate. There is a focal area of apparent air within the intrahepatic biliary ductal system. If patient has had previous sphincterotomy, air in the biliary ductal system could be explained on an iatrogenic basis. Given the other findings,  early cholangitis is of concern given this finding. Electronically Signed   By: Bretta BangWilliam  Woodruff III M.D.   On: 01/09/2019 14:13        Scheduled Meds:  enoxaparin (LOVENOX) injection  40 mg Subcutaneous Q24H    HYDROmorphone (DILAUDID) injection  0.5 mg Intravenous Q6H   ondansetron (ZOFRAN) IV  4 mg Intravenous Q6H   Continuous Infusions:  lactated ringers Stopped (01/10/19 1442)   piperacillin-tazobactam (ZOSYN)  IV 3.375 g (01/11/19 [id number])     LOS: 2 days    Time spent: 30 minutes    Sherita Decoste Hoover Brunette Maigen Mozingo, DO [facility] Pager [phone]  If 7PM-7AM, please contact night-coverage [email or web address] Password [email or web address] 01/11/2019, 10:08 AM

## 2019-01-11 NOTE — Progress Notes (Signed)
  Subjective:  Patient has no complaints.  She states her throat is not sore anymore.  She has mild cough.  She denies abdominal pain nausea or vomiting.  She is hungry.  She says Dr. Arnoldo Morale saw her earlier today and felt her diet could be advanced. Objective: Blood pressure 132/70, pulse 61, temperature 98.7 F (37.1 C), resp. rate 17, height '5\' 7"'$  (1.702 m), weight 88.9 kg, last menstrual period 12/30/2018, SpO2 100 %. Patient is alert and in no acute distress. Abdomen is full.  Bowel sounds are normal.  On palpation is soft and nontender with organomegaly or masses.  Labs/studies Results:  CBC Latest Ref Rng & Units 01/11/2019 01/10/2019 01/09/2019  WBC 4.0 - 10.5 K/uL 3.6(L) 3.8(L) 5.9  Hemoglobin 12.0 - 15.0 g/dL 13.5 12.8 14.2  Hematocrit 36.0 - 46.0 % 41.4 39.8 43.6  Platelets 150 - 400 K/uL 192 173 193    CMP Latest Ref Rng & Units 01/11/2019 01/10/2019 01/09/2019  Glucose 70 - 99 mg/dL 98 76 98  BUN 6 - 20 mg/dL <5(L) <5(L) 5(L)  Creatinine 0.44 - 1.00 mg/dL 0.52 0.47 0.48  Sodium 135 - 145 mmol/L 139 146(H) 138  Potassium 3.5 - 5.1 mmol/L 3.6 4.0 3.2(L)  Chloride 98 - 111 mmol/L 104 110 104  CO2 22 - 32 mmol/L '25 22 23  '$ Calcium 8.9 - 10.3 mg/dL 9.3 8.6(L) 9.0  Total Protein 6.5 - 8.1 g/dL 6.8 6.6 7.4  Total Bilirubin 0.3 - 1.2 mg/dL 2.9(H) 3.7(H) 5.5(H)  Alkaline Phos 38 - 126 U/L 295(H) 332(H) 434(H)  AST 15 - 41 U/L 68(H) 62(H) 103(H)  ALT 0 - 44 U/L 120(H) 136(H) 197(H)    Hepatic Function Latest Ref Rng & Units 01/11/2019 01/10/2019 01/09/2019  Total Protein 6.5 - 8.1 g/dL 6.8 6.6 7.4  Albumin 3.5 - 5.0 g/dL 3.2(L) 3.2(L) 3.8  AST 15 - 41 U/L 68(H) 62(H) 103(H)  ALT 0 - 44 U/L 120(H) 136(H) 197(H)  Alk Phosphatase 38 - 126 U/L 295(H) 332(H) 434(H)  Total Bilirubin 0.3 - 1.2 mg/dL 2.9(H) 3.7(H) 5.5(H)      Assessment:  #1.  Choledocholithiasis.  Patient is status post ERCP with sphincterotomy and stone extraction.   Significant improvement in bilirubin and  transaminases since the procedure.  #2.  Biliary pancreatitis.  She had mild pancreatitis to begin with.  She is now pain-free. Patient is hungry.  She has been cleared by Dr. Arnoldo Morale to advance diet.  #3.  Cholelithiasis.  Patient to undergo lap chole on 01/13/2019  Recommendations:  Diet advanced to heart healthy. Will check LFTs on 01/13/2019.

## 2019-01-11 NOTE — Progress Notes (Signed)
1 Day Post-Op  Subjective: Patient denies any significant abdominal pain.  Objective: Vital signs in last 24 hours: Temp:  [97.5 F (36.4 C)-98.7 F (37.1 C)] 98.7 F (37.1 C) (07/18 0522) Pulse Rate:  [61-89] 61 (07/18 0522) Resp:  [13-20] 17 (07/18 0522) BP: (117-137)/(70-89) 132/70 (07/18 0522) SpO2:  [93 %-100 %] 100 % (07/18 0522) Last BM Date: (PTA)  Intake/Output from previous day: 07/17 0701 - 07/18 0700 In: 1200 [P.O.:300; I.V.:900] Out: 200 [Urine:200] Intake/Output this shift: No intake/output data recorded.  General appearance: alert, cooperative and no distress GI: soft, non-tender; bowel sounds normal; no masses,  no organomegaly  Lab Results:  Recent Labs    01/10/19 0424 01/11/19 0549  WBC 3.8* 3.6*  HGB 12.8 13.5  HCT 39.8 41.4  PLT 173 192   BMET Recent Labs    01/10/19 0424 01/11/19 0549  NA 146* 139  K 4.0 3.6  CL 110 104  CO2 22 25  GLUCOSE 76 98  BUN <5* <5*  CREATININE 0.47 0.52  CALCIUM 8.6* 9.3   PT/INR No results for input(s): LABPROT, INR in the last 72 hours.  Studies/Results: Ct Abdomen W Contrast  Result Date: 01/09/2019 CLINICAL DATA:  Preprocedural evaluation prior to ERCP. EXAM: CT ABDOMEN WITH CONTRAST TECHNIQUE: Multidetector CT imaging of the abdomen was performed using the standard protocol following bolus administration of intravenous contrast. CONTRAST:  147mL OMNIPAQUE IOHEXOL 300 MG/ML  SOLN COMPARISON:  None. FINDINGS: Lower chest: Normal heart size. Dependent atelectasis within the bilateral lower lobes. Hepatobiliary: The liver is normal in size and contour. No focal hepatic lesion is identified. Multiple stones within the gallbladder. There is gallbladder wall thickening and pericholecystic fat stranding. Common bile duct is dilated measuring up to 9 mm. Suggestion of enhancement of the wall of the common bile duct and central intrahepatic bile ducts. There are at least 2 stones identified within the mid to distal  common bile duct each measuring approximately 5 mm (image 53; series 7). Pancreas: There is fat stranding about the pancreatic head/uncinate process. Spleen: Unremarkable Adrenals/Urinary Tract: Normal adrenal glands. Kidneys enhance symmetrically with contrast. No hydronephrosis. Stomach/Bowel: There is wall thickening of the second third portions of the duodenum, potentially reactive in etiology. Additionally, there is wall thickening of the hepatic flexure of the colon, adjacent to the gallbladder (image 36; series 2), potentially reactive in etiology. No free intraperitoneal air visualized small bowel is unremarkable. Vascular/Lymphatic: Normal caliber abdominal aorta. Circumaortic left renal vein. No retroperitoneal lymphadenopathy. Other: Small amount of fluid within the upper abdomen and pericolic gutters bilaterally. Musculoskeletal: No aggressive or acute appearing osseous lesions. IMPRESSION: 1. There are at least 2 stones within the common bile duct compatible with choledocholithiasis. Additionally, there is gallbladder wall thickening and surrounding fat stranding suggestive of acute cholecystitis. Apparent enhancement of the common bile duct wall and central intrahepatic bile ducts may represent cholangitis. 2. There is fat stranding and small amount of fluid about the pancreatic head and uncinate process which may represent associated pancreatitis. 3. There is wall thickening of the second and third portions of the duodenum as well as the hepatic flexure of the colon, favored to be reactive in etiology from the adjacent acute process. 4. These results will be called to the ordering clinician or representative by the Radiologist Assistant, and communication documented in the PACS or zVision Dashboard. Electronically Signed   By: Lovey Newcomer M.D.   On: 01/09/2019 17:45   Dg Ercp With Sphincterotomy  Result Date: 01/10/2019  CLINICAL DATA:  Choledocholithiasis EXAM: ERCP TECHNIQUE: Multiple spot images  obtained with the fluoroscopic device and submitted for interpretation post-procedure. COMPARISON:  CT from the previous day FINDINGS: Series of fluoroscopic runs and images document endoscopic cannulation and opacification of the CBD. At least 2 multifaceted calculi in the distal CBD, near occlusive. Innumerable faceted calculi are noted in the lumen of the gallbladder. Cystic duct is patent. Incomplete opacification/visualization of the intrahepatic biliary tree, which appears decompressed centrally. Images document subsequent basket passage through the CBD with clearance of evident calculi. No extravasation. IMPRESSION: 1. Choledocholithiasis with endoscopic CBD cannulation and intervention. 2. Cholelithiasis. These images were submitted for radiologic interpretation only. Please see the procedural report for the amount of contrast and the fluoroscopy time utilized. Electronically Signed   By: Corlis Leak  Hassell M.D.   On: 01/10/2019 14:42   [place] Abdomen Limited Ruq  Result Date: 01/09/2019 CLINICAL DATA:  Jaundice with right upper quadrant pain EXAM: ULTRASOUND ABDOMEN LIMITED RIGHT UPPER QUADRANT COMPARISON:  None. FINDINGS: Gallbladder: Within the gallbladder, there are multiple echogenic foci which move and shadow consistent with cholelithiasis. Largest gallstone measures 1.1 cm in length. The gallbladder wall appears mildly thickened and slightly edematous. There is no pericholecystic fluid. No sonographic Murphy sign noted by sonographer. Common bile duct: Diameter: 9 mm which is prominent. Note that portions of the distal common bile duct are obscured by gas. There is questionable focus of air within the intrahepatic biliary ductal system. Liver: No focal lesion identified. Within normal limits in parenchymal echogenicity. Portal vein is patent on color Doppler imaging with normal direction of blood flow towards the liver. IMPRESSION: 1. Cholelithiasis with mild gallbladder wall thickening. Gallbladder wall  appears slightly edematous. Suspect early acute cholecystitis. 2. Prominence of the common bile duct proximally. More distally, the common bile duct is obscured by gas. A distal calculus cannot be excluded given this circumstance. From an imaging standpoint, MRCP would be the imaging study of choice to further evaluate. There is a focal area of apparent air within the intrahepatic biliary ductal system. If patient has had previous sphincterotomy, air in the biliary ductal system could be explained on an iatrogenic basis. Given the other findings, early cholangitis is of concern given this finding. Electronically Signed   By: Bretta BangWilliam  Woodruff III M.D.   On: 01/09/2019 14:13    Anti-infectives: Anti-infectives (From admission, onward)   Start     Dose/Rate Route Frequency Ordered Stop   01/09/19 2200  piperacillin-tazobactam (ZOSYN) IVPB 3.375 g     3.375 g 12.5 mL/hr over 240 Minutes Intravenous Every 8 hours 01/09/19 1553     01/09/19 1430  piperacillin-tazobactam (ZOSYN) IVPB 3.375 g     3.375 g 100 mL/hr over 30 Minutes Intravenous  Once 01/09/19 1425 01/09/19 1510      Assessment/Plan: s/p Procedure(s): ENDOSCOPIC RETROGRADE CHOLANGIOPANCREATOGRAPHY (ERCP) SPHINCTEROTOMY BALLOON DILATATION AND STONE EXTRACTION Impression: Status post ERCP with stone extraction Plan: May advance diet per GI.  Patient is scheduled for laparoscopic cholecystectomy on 01/13/2019.  LOS: 2 days    Franky MachoMark Athenia Rys 01/11/2019

## 2019-01-12 LAB — SURGICAL PCR SCREEN
MRSA, PCR: NEGATIVE
Staphylococcus aureus: NEGATIVE

## 2019-01-12 LAB — CBC
HCT: 41.4 % (ref 36.0–46.0)
Hemoglobin: 13.6 g/dL (ref 12.0–15.0)
MCH: 29.7 pg (ref 26.0–34.0)
MCHC: 32.9 g/dL (ref 30.0–36.0)
MCV: 90.4 fL (ref 80.0–100.0)
Platelets: 217 10*3/uL (ref 150–400)
RBC: 4.58 MIL/uL (ref 3.87–5.11)
RDW: 13.3 % (ref 11.5–15.5)
WBC: 3.5 10*3/uL — ABNORMAL LOW (ref 4.0–10.5)
nRBC: 0 % (ref 0.0–0.2)

## 2019-01-12 LAB — COMPREHENSIVE METABOLIC PANEL
ALT: 114 U/L — ABNORMAL HIGH (ref 0–44)
AST: 67 U/L — ABNORMAL HIGH (ref 15–41)
Albumin: 3.5 g/dL (ref 3.5–5.0)
Alkaline Phosphatase: 274 U/L — ABNORMAL HIGH (ref 38–126)
Anion gap: 10 (ref 5–15)
BUN: 6 mg/dL (ref 6–20)
CO2: 23 mmol/L (ref 22–32)
Calcium: 9.1 mg/dL (ref 8.9–10.3)
Chloride: 105 mmol/L (ref 98–111)
Creatinine, Ser: 0.49 mg/dL (ref 0.44–1.00)
GFR calc Af Amer: >60 mL/min (ref >60)
GFR calc non Af Amer: >60 mL/min (ref >60)
Glucose, Bld: 106 mg/dL — ABNORMAL HIGH (ref 70–99)
Potassium: 3.5 mmol/L (ref 3.5–5.1)
Sodium: 138 mmol/L (ref 135–145)
Total Bilirubin: 2.2 mg/dL — ABNORMAL HIGH (ref 0.3–1.2)
Total Protein: 7.2 g/dL (ref 6.5–8.1)

## 2019-01-12 MED ORDER — CHLORHEXIDINE GLUCONATE CLOTH 2 % EX PADS: 6.0000 | MEDICATED_PAD | Freq: Once | CUTANEOUS | Status: DC

## 2019-01-12 MED ORDER — CHLORHEXIDINE GLUCONATE CLOTH 2 % EX PADS
6.0000 | MEDICATED_PAD | Freq: Once | CUTANEOUS | Status: AC
  Administered 2019-01-12: 6 via TOPICAL

## 2019-01-12 MED ORDER — MUPIROCIN 2 % EX OINT
1.0000 "application " | TOPICAL_OINTMENT | Freq: Two times a day (BID) | CUTANEOUS | Status: DC
  Administered 2019-01-12: 1 via NASAL
  Filled 2019-01-12: qty 22

## 2019-01-12 NOTE — Progress Notes (Signed)
2 Days Post-Op  Subjective: Patient tolerating regular diet well.  Denies any abdominal pain or nausea.  Objective: Vital signs in last 24 hours: Temp:  [97.7 F (36.5 C)-98.1 F (36.7 C)] 98.1 F (36.7 C) (07/19 0456) Pulse Rate:  [60-64] 60 (07/19 0456) Resp:  [16-17] 17 (07/19 0456) BP: (117-122)/(66-77) 117/66 (07/19 0456) SpO2:  [98 %-99 %] 99 % (07/19 0456) Last BM Date: 01/11/19(per patient)  Intake/Output from previous day: 07/18 0701 - 07/19 0700 In: 480 [P.O.:480] Out: -  Intake/Output this shift: No intake/output data recorded.  General appearance: alert, cooperative and no distress GI: soft, non-tender; bowel sounds normal; no masses,  no organomegaly  Lab Results:  Recent Labs    01/11/19 0549 01/12/19 0614  WBC 3.6* 3.5*  HGB 13.5 13.6  HCT 41.4 41.4  PLT 192 217   BMET Recent Labs    01/11/19 0549 01/12/19 0614  NA 139 138  K 3.6 3.5  CL 104 105  CO2 25 23  GLUCOSE 98 106*  BUN <5* 6  CREATININE 0.52 0.49  CALCIUM 9.3 9.1   PT/INR No results for input(s): LABPROT, INR in the last 72 hours.  Studies/Results: Dg Ercp With Sphincterotomy  Result Date: 01/10/2019 CLINICAL DATA:  Choledocholithiasis EXAM: ERCP TECHNIQUE: Multiple spot images obtained with the fluoroscopic device and submitted for interpretation post-procedure. COMPARISON:  CT from the previous day FINDINGS: Series of fluoroscopic runs and images document endoscopic cannulation and opacification of the CBD. At least 2 multifaceted calculi in the distal CBD, near occlusive. Innumerable faceted calculi are noted in the lumen of the gallbladder. Cystic duct is patent. Incomplete opacification/visualization of the intrahepatic biliary tree, which appears decompressed centrally. Images document subsequent basket passage through the CBD with clearance of evident calculi. No extravasation. IMPRESSION: 1. Choledocholithiasis with endoscopic CBD cannulation and intervention. 2. Cholelithiasis.  These images were submitted for radiologic interpretation only. Please see the procedural report for the amount of contrast and the fluoroscopy time utilized. Electronically Signed   By: Lucrezia Europe M.D.   On: 01/10/2019 14:42    Anti-infectives: Anti-infectives (From admission, onward)   Start     Dose/Rate Route Frequency Ordered Stop   01/09/19 2200  piperacillin-tazobactam (ZOSYN) IVPB 3.375 g     3.375 g 12.5 mL/hr over 240 Minutes Intravenous Every 8 hours 01/09/19 1553     01/09/19 1430  piperacillin-tazobactam (ZOSYN) IVPB 3.375 g     3.375 g 100 mL/hr over 30 Minutes Intravenous  Once 01/09/19 1425 01/09/19 1510      Assessment/Plan: s/p Procedure(s): ENDOSCOPIC RETROGRADE CHOLANGIOPANCREATOGRAPHY (ERCP) SPHINCTEROTOMY BALLOON DILATATION AND STONE EXTRACTION Impression: Gallstone pancreatitis with choledocholithiasis.  Liver enzyme tests normalizing. Plan: We will proceed with laparoscopic cholecystectomy tomorrow.  The risks and benefits of the procedure including bleeding, infection, hepatobiliary injury, and the possibility of an open procedure were fully explained to the patient, who gave informed consent.  LOS: 3 days    Aviva Signs 01/12/2019

## 2019-01-12 NOTE — H&P (View-Only) (Signed)
2 Days Post-Op  Subjective: Patient tolerating regular diet well.  Denies any abdominal pain or nausea.  Objective: Vital signs in last 24 hours: Temp:  [97.7 F (36.5 C)-98.1 F (36.7 C)] 98.1 F (36.7 C) (07/19 0456) Pulse Rate:  [60-64] 60 (07/19 0456) Resp:  [16-17] 17 (07/19 0456) BP: (117-122)/(66-77) 117/66 (07/19 0456) SpO2:  [98 %-99 %] 99 % (07/19 0456) Last BM Date: 01/11/19(per patient)  Intake/Output from previous day: 07/18 0701 - 07/19 0700 In: 480 [P.O.:480] Out: -  Intake/Output this shift: No intake/output data recorded.  General appearance: alert, cooperative and no distress GI: soft, non-tender; bowel sounds normal; no masses,  no organomegaly  Lab Results:  Recent Labs    01/11/19 0549 01/12/19 0614  WBC 3.6* 3.5*  HGB 13.5 13.6  HCT 41.4 41.4  PLT 192 217   BMET Recent Labs    01/11/19 0549 01/12/19 0614  NA 139 138  K 3.6 3.5  CL 104 105  CO2 25 23  GLUCOSE 98 106*  BUN <5* 6  CREATININE 0.52 0.49  CALCIUM 9.3 9.1   PT/INR No results for input(s): LABPROT, INR in the last 72 hours.  Studies/Results: Dg Ercp With Sphincterotomy  Result Date: 01/10/2019 CLINICAL DATA:  Choledocholithiasis EXAM: ERCP TECHNIQUE: Multiple spot images obtained with the fluoroscopic device and submitted for interpretation post-procedure. COMPARISON:  CT from the previous day FINDINGS: Series of fluoroscopic runs and images document endoscopic cannulation and opacification of the CBD. At least 2 multifaceted calculi in the distal CBD, near occlusive. Innumerable faceted calculi are noted in the lumen of the gallbladder. Cystic duct is patent. Incomplete opacification/visualization of the intrahepatic biliary tree, which appears decompressed centrally. Images document subsequent basket passage through the CBD with clearance of evident calculi. No extravasation. IMPRESSION: 1. Choledocholithiasis with endoscopic CBD cannulation and intervention. 2. Cholelithiasis.  These images were submitted for radiologic interpretation only. Please see the procedural report for the amount of contrast and the fluoroscopy time utilized. Electronically Signed   By: D  Hassell M.D.   On: 01/10/2019 14:42    Anti-infectives: Anti-infectives (From admission, onward)   Start     Dose/Rate Route Frequency Ordered Stop   01/09/19 2200  piperacillin-tazobactam (ZOSYN) IVPB 3.375 g     3.375 g 12.5 mL/hr over 240 Minutes Intravenous Every 8 hours 01/09/19 1553     01/09/19 1430  piperacillin-tazobactam (ZOSYN) IVPB 3.375 g     3.375 g 100 mL/hr over 30 Minutes Intravenous  Once 01/09/19 1425 01/09/19 1510      Assessment/Plan: s/p Procedure(s): ENDOSCOPIC RETROGRADE CHOLANGIOPANCREATOGRAPHY (ERCP) SPHINCTEROTOMY BALLOON DILATATION AND STONE EXTRACTION Impression: Gallstone pancreatitis with choledocholithiasis.  Liver enzyme tests normalizing. Plan: We will proceed with laparoscopic cholecystectomy tomorrow.  The risks and benefits of the procedure including bleeding, infection, hepatobiliary injury, and the possibility of an open procedure were fully explained to the patient, who gave informed consent.  LOS: 3 days    Sonya Mcdonald 01/12/2019 

## 2019-01-12 NOTE — Progress Notes (Signed)
Pharmacy Antibiotic Note   Today is day #4 of Zosyn therapy for this 5-yo female with  choledocholithiasis,  biliary pancreatitis and calculus cholecystitis. She is POD #2 from ERCP with sphincterotomy and balloon dilation w/ stone extraction. She's currently afebrile, with normal WBC count and negative urine culture. Renal function remains stable.  Plan: Continue Zosyn 3.375g IV q8h (4-hr infusion).  Continue to monitor labs, c/s, and patient improvement.   Height: 5\' 7"  (170.2 cm) Weight: 196 lb (88.9 kg) IBW/kg (Calculated) : 61.6  Temp (24hrs), Avg:97.9 F (36.6 C), Min:97.7 F (36.5 C), Max:98.1 F (36.7 C)  Recent Labs  Lab 01/09/19 1309 01/10/19 0424 01/11/19 0549 01/12/19 0614  WBC 5.9 3.8* 3.6* 3.5*  CREATININE 0.48 0.47 0.52 0.49    Estimated Creatinine Clearance: 119.8 mL/min (by C-G formula based on SCr of 0.49 mg/dL).    Not on File  Antimicrobials this admission: Zosyn 7/16 >>     Microbiology results: 7/16 UCx: NG (final)   Thank you for allowing pharmacy to be a part of this patient's care.  Despina Pole 01/12/2019 10:14 AM

## 2019-01-12 NOTE — Progress Notes (Signed)
  Subjective:  Patient has no complaints.  She is eating 50 to 100% of her meals and not experiencing nausea or abdominal pain.  She is passing flatus.  Objective: Blood pressure 117/66, pulse 60, temperature 98.1 F (36.7 C), temperature source Oral, resp. rate 17, height _0  (1.702 m), weight 88.9 kg, last menstrual period 12/30/2018, SpO2 99 %. Patient is alert and in no acute distress. Sclera does not appear to be icteric anymore. Abdomen is soft and nontender with organomegaly or masses.  Labs/studies Results:  CBC Latest Ref Rng & Units 01/12/2019 01/11/2019 01/10/2019  WBC 4.0 - 10.5 K/uL 3.5(L) 3.6(L) 3.8(L)  Hemoglobin 12.0 - 15.0 g/dL 13.6 13.5 12.8  Hematocrit 36.0 - 46.0 % 41.4 41.4 39.8  Platelets 150 - 400 K/uL 217 192 173    CMP Latest Ref Rng & Units 01/12/2019 01/11/2019 01/10/2019  Glucose 70 - 99 mg/dL 106(H) 98 76  BUN 6 - 20 mg/dL 6 <5(L) <5(L)  Creatinine 0.44 - 1.00 mg/dL 0.49 0.52 0.47  Sodium 135 - 145 mmol/L 138 139 146(H)  Potassium 3.5 - 5.1 mmol/L 3.5 3.6 4.0  Chloride 98 - 111 mmol/L 105 104 110  CO2 22 - 32 mmol/L _1 Calcium 8.9 - 10.3 mg/dL 9.1 9.3 8.6(L)  Total Protein 6.5 - 8.1 g/dL 7.2 6.8 6.6  Total Bilirubin 0.3 - 1.2 mg/dL 2.2(H) 2.9(H) 3.7(H)  Alkaline Phos 38 - 126 U/L 274(H) 295(H) 332(H)  AST 15 - 41 U/L 67(H) 68(H) 62(H)  ALT 0 - 44 U/L 114(H) 120(H) 136(H)    Hepatic Function Latest Ref Rng & Units 01/12/2019 01/11/2019 01/10/2019  Total Protein 6.5 - 8.1 g/dL 7.2 6.8 6.6  Albumin 3.5 - 5.0 g/dL 3.5 3.2(L) 3.2(L)  AST 15 - 41 U/L 67(H) 68(H) 62(H)  ALT 0 - 44 U/L 114(H) 120(H) 136(H)  Alk Phosphatase 38 - 126 U/L 274(H) 295(H) 332(H)  Total Bilirubin 0.3 - 1.2 mg/dL 2.2(H) 2.9(H) 3.7(H)     Assessment:  #1.  Choledocholithiasis.  Patient is status post therapeutic ERCP 2 days ago.  Continued improvement in transaminases and bilirubin.  #2.  Biliary pancreatitis.  She appears to have mild pancreatitis based on her symptoms lab  and CT criterion.  She is pain-free.  She is tolerating diet.  #3.  Cholelithiasis.  She may also have cholecystitis.  She is for cholecystectomy tomorrow by Dr. Arnoldo Morale.  #4.  Mild leukopenia.  Significance not clear.  Recommendations:  Consider repeat CBC and LFTs in 2 to 3 weeks that she can have with Dr. Gar Ponto her primary care physician. Will sign off.

## 2019-01-12 NOTE — Progress Notes (Signed)
PROGRESS NOTE    Sonya Mcdonald  ZOX:[id number] DOB: May 27, 1991 DOA: 01/09/2019 PCP: Patient, No Pcp Per   Brief Narrative:  Per HPI: Sonya NosePatricia Mcdonald a 28 y.o.femalewithno significant past medical history who presented to the emergency department with sudden onset right upper quadrant abdominal pain along with some nausea and one episode of emesis that began yesterday. She denies any fevers or chills, nor has she had any hematemesis, or diarrhea. She denies any sick contacts. She does state that her skin has been turning more yellow in color as well. She states that the pain is dull and achy in nature and does not seem to radiate. She denies any aggravating or alleviating factors. She has never had pain similar to this previously. She states that she only drinks alcohol socially and on occasion. She denies any tobacco use.  Patient was admitted with choledocholithiasis resulting in jaundice along with mild biliary pancreatitis as well as calculus cholecystitis.  She is status post ERCP with sphincterotomy and balloon dilation and stone extraction which was performed on 7/17.  She is now awaiting laparoscopic cholecystectomy planned for 7/20.   Assessment & Plan:   Active Problems:   Acute cholecystitis   Acute cholecystitis  Acute choledocholithiasis with jaundice and mild biliary pancreatitis status post ERCP with balloon dilation and stone extraction on 7/17 -Awaiting laparoscopic cholecystectomy on 7/20 per general surgery -Repeat a.m. labs along with CBC -Continue IV fluid at lower rate and pain medications as needed -Zofran as needed for nausea and vomiting -Advance diet as tolerated per GI  Acute calculus cholecystitis -Plans for cholecystectomy on 7/20 per general surgery   DVT prophylaxis:Lovenox Code Status:Full Family Communication:None at bedside Disposition Plan:ERCP on 7/17 with cholecystectomy planned for 7/20. Appreciate further GI and  general surgery recommendations.   Consultants:  GI  General surgery  Procedures:  ERCP with sphincterotomy and stone extraction 7/17  Antimicrobials:  Anti-infectives (From admission, onward)   Start     Dose/Rate Route Frequency Ordered Stop   01/09/19 2200  piperacillin-tazobactam (ZOSYN) IVPB 3.375 g     3.375 g 12.5 mL/hr over 240 Minutes Intravenous Every 8 hours 01/09/19 1553     01/09/19 1430  piperacillin-tazobactam (ZOSYN) IVPB 3.375 g     3.375 g 100 mL/hr over 30 Minutes Intravenous  Once 01/09/19 1425 01/09/19 1510       Subjective: Patient seen and evaluated today with no new acute complaints or concerns. No acute concerns or events noted overnight.  Objective: Vitals:   01/11/19 0522 01/11/19 2025 01/11/19 2052 01/12/19 0456  BP: 132/70 122/77  117/66  Pulse: 61 64  60  Resp: 17 16  17   Temp: 98.7 F (37.1 C) 97.7 F (36.5 C)  98.1 F (36.7 C)  TempSrc:  Oral  Oral  SpO2: 100% 98% 98% 99%  Weight:      Height:        Intake/Output Summary (Last 24 hours) at 01/12/2019 0934 Last data filed at 01/11/2019 1754 Gross per 24 hour  Intake 480 ml  Output -  Net 480 ml   Filed Weights   01/09/19 1205  Weight: 88.9 kg    Examination:  General exam: Appears calm and comfortable  Respiratory system: Clear to auscultation. Respiratory effort normal. Cardiovascular system: S1 & S2 heard, RRR. No JVD, murmurs, rubs, gallops or clicks. No pedal edema. Gastrointestinal system: Abdomen is nondistended, soft and nontender. No organomegaly or masses felt. Normal bowel sounds heard. Central nervous system: Alert and oriented. No  focal neurological deficits. Extremities: Symmetric 5 x 5 power. Skin: No rashes, lesions or ulcers Psychiatry: Judgement and insight appear normal. Mood & affect appropriate.     Data Reviewed: I have personally reviewed following labs and imaging studies  CBC: Recent Labs  Lab 01/09/19 1309 01/10/19 0424 01/11/19  0549 01/12/19 0614  WBC 5.9 3.8* 3.6* 3.5*  NEUTROABS 4.2  --   --   --   HGB 14.2 12.8 13.5 13.6  HCT 43.6 39.8 41.4 41.4  MCV 91.0 91.5 90.0 90.4  PLT 193 173 192 937   Basic Metabolic Panel: Recent Labs  Lab 01/09/19 1309 01/09/19 2152 01/10/19 0424 01/11/19 0549 01/12/19 0614  NA 138  --  146* 139 138  K 3.2*  --  4.0 3.6 3.5  CL 104  --  110 104 105  CO2 23  --  22 25 23   GLUCOSE 98  --  76 98 106*  BUN 5*  --  <5* <5* 6  CREATININE 0.48  --  0.47 0.52 0.49  CALCIUM 9.0  --  8.6* 9.3 9.1  MG  --  2.0  --   --   --    GFR: Estimated Creatinine Clearance: 119.8 mL/min (by C-G formula based on SCr of 0.49 mg/dL). Liver Function Tests: Recent Labs  Lab 01/09/19 1309 01/10/19 0424 01/11/19 0549 01/12/19 0614  AST 103* 62* 68* 67*  ALT 197* 136* 120* 114*  ALKPHOS 434* 332* 295* 274*  BILITOT 5.5* 3.7* 2.9* 2.2*  PROT 7.4 6.6 6.8 7.2  ALBUMIN 3.8 3.2* 3.2* 3.5   Recent Labs  Lab 01/09/19 1309 01/10/19 0424 01/10/19 1510  LIPASE 775* 289* 112*   No results for input(s): AMMONIA in the last 168 hours. Coagulation Profile: No results for input(s): INR, PROTIME in the last 168 hours. Cardiac Enzymes: No results for input(s): CKTOTAL, CKMB, CKMBINDEX, TROPONINI in the last 168 hours. BNP (last 3 results) No results for input(s): PROBNP in the last 8760 hours. HbA1C: No results for input(s): HGBA1C in the last 72 hours. CBG: No results for input(s): GLUCAP in the last 168 hours. Lipid Profile: No results for input(s): CHOL, HDL, LDLCALC, TRIG, CHOLHDL, LDLDIRECT in the last 72 hours. Thyroid Function Tests: No results for input(s): TSH, T4TOTAL, FREET4, T3FREE, THYROIDAB in the last 72 hours. Anemia Panel: No results for input(s): VITAMINB12, FOLATE, FERRITIN, TIBC, IRON, RETICCTPCT in the last 72 hours. Sepsis Labs: No results for input(s): PROCALCITON, LATICACIDVEN in the last 168 hours.  Recent Results (from the past 240 hour(s))  Urine culture      Status: None   Collection Time: 01/09/19  1:13 PM   Specimen: Urine, Clean Catch  Result Value Ref Range Status   Specimen Description   Final    URINE, CLEAN CATCH Performed at [facility], [address] [place], [place] [address]    Special Requests   Final    NONE Performed at [facility], [address], [place], [place] [address]    Culture   Final    NO GROWTH Performed at [facility], [place] [address], [place], [place] [address]    Report Status 01/10/2019 FINAL  Final  SARS Coronavirus 2 (CEPHEID - Performed in [place] hospital lab), Hosp Order     Status: None   Collection Time: 01/09/19  2:38 PM   Specimen: Nasopharyngeal Swab  Result Value Ref Range Status   SARS Coronavirus 2 NEGATIVE NEGATIVE Final    Comment: (NOTE) If  result is NEGATIVE SARS-CoV-2 target nucleic acids are NOT DETECTED. The SARS-CoV-2 RNA is generally detectable in upper and lower  respiratory specimens during the acute phase of infection. The lowest  concentration of SARS-CoV-2 viral copies this assay can detect is 250  copies / mL. A negative result does not preclude SARS-CoV-2 infection  and should not be used as the sole basis for treatment or other  patient management decisions.  A negative result may occur with  improper specimen collection / handling, submission of specimen other  than nasopharyngeal swab, presence of viral mutation(s) within the  areas targeted by this assay, and inadequate number of viral copies  (<250 copies / mL). A negative result must be combined with clinical  observations, patient history, and epidemiological information. If result is POSITIVE SARS-CoV-2 target nucleic acids are DETECTED. The SARS-CoV-2 RNA is generally detectable in upper and lower  respiratory specimens dur ing the acute phase of infection.  Positive  results are indicative of active infection with SARS-CoV-2.  Clinical  correlation with patient history and other diagnostic  information is  necessary to determine patient infection status.  Positive results do  not rule out bacterial infection or co-infection with other viruses. If result is PRESUMPTIVE POSTIVE SARS-CoV-2 nucleic acids MAY BE PRESENT.   A presumptive positive result was obtained on the submitted specimen  and confirmed on repeat testing.  While 2019 novel coronavirus  (SARS-CoV-2) nucleic acids may be present in the submitted sample  additional confirmatory testing may be necessary for epidemiological  and / or clinical management purposes  to differentiate between  SARS-CoV-2 and other Sarbecovirus currently known to infect humans.  If clinically indicated additional testing with an alternate test  methodology [phone]([id number]) is advised. The SARS-CoV-2 RNA is generally  detectable in upper and lower respiratory sp ecimens during the acute  phase of infection. The expected result is Negative. Fact Sheet for Patients:  BoilerBrush.com.cyhttps://www.fda.gov/media/136312/download Fact Sheet for Healthcare Providers: https://pope.com/https://www.fda.gov/media/136313/download This test is not yet approved or cleared by the [place] FDA and has been authorized for detection and/or diagnosis of SARS-CoV-2 by FDA under an Emergency Use Authorization (EUA).  This EUA will remain in effect (meaning this test can be used) for the duration of the COVID-19 declaration under Section 564(b)(1) of the Act, 21 U.S.C. section 360bbb-3(b)(1), unless the authorization is terminated or revoked sooner. Performed at [facility], [address] [place], [place] [address]          Radiology Studies: Dg Ercp With Sphincterotomy  Result Date: 01/10/2019 CLINICAL DATA:  Choledocholithiasis EXAM: ERCP TECHNIQUE: Multiple spot images obtained with the fluoroscopic device and submitted for interpretation post-procedure. COMPARISON:  CT from the previous day FINDINGS: Series of fluoroscopic runs and images document endoscopic cannulation and  opacification of the CBD. At least 2 multifaceted calculi in the distal CBD, near occlusive. Innumerable faceted calculi are noted in the lumen of the gallbladder. Cystic duct is patent. Incomplete opacification/visualization of the intrahepatic biliary tree, which appears decompressed centrally. Images document subsequent basket passage through the CBD with clearance of evident calculi. No extravasation. IMPRESSION: 1. Choledocholithiasis with endoscopic CBD cannulation and intervention. 2. Cholelithiasis. These images were submitted for radiologic interpretation only. Please see the procedural report for the amount of contrast and the fluoroscopy time utilized. Electronically Signed   By: Corlis Leak  Hassell M.D.   On: 01/10/2019 14:42        Scheduled Meds: . enoxaparin (LOVENOX) injection  40 mg Subcutaneous Q24H  .  HYDROmorphone (  DILAUDID) injection  0.5 mg Intravenous Q6H  . ondansetron (ZOFRAN) IV  4 mg Intravenous Q6H   Continuous Infusions: . lactated ringers 75 mL/hr (01/11/19 1524)  . piperacillin-tazobactam (ZOSYN)  IV 3.375 g (01/12/19 0501)     LOS: 3 days    Time spent: 30 minutes    Raymie Trani Hoover Brunette Shirline Kendle, DO [facility] Pager [phone]  If 7PM-7AM, please contact night-coverage [email or web address] Password [email or web address] 01/12/2019, 9:34 AM

## 2019-01-13 ENCOUNTER — Other Ambulatory Visit: Payer: Self-pay

## 2019-01-13 ENCOUNTER — Inpatient Hospital Stay (HOSPITAL_COMMUNITY): Payer: BC Managed Care – PPO | Admitting: Anesthesiology

## 2019-01-13 ENCOUNTER — Encounter (HOSPITAL_COMMUNITY)
Admission: EM | Disposition: A | Discharge status: Home / Self Care | Payer: Self-pay | Source: Home / Self Care | Attending: Internal Medicine

## 2019-01-13 ENCOUNTER — Encounter (HOSPITAL_COMMUNITY): Payer: Self-pay | Admitting: Emergency Medicine

## 2019-01-13 HISTORY — PX: CHOLECYSTECTOMY: SHX55

## 2019-01-13 LAB — CBC
HCT: 43.8 % (ref 36.0–46.0)
Hemoglobin: 14.2 g/dL (ref 12.0–15.0)
MCH: 28.9 pg (ref 26.0–34.0)
MCHC: 32.4 g/dL (ref 30.0–36.0)
MCV: 89.2 fL (ref 80.0–100.0)
Platelets: 210 10*3/uL (ref 150–400)
RBC: 4.91 MIL/uL (ref 3.87–5.11)
RDW: 13.2 % (ref 11.5–15.5)
WBC: 4.5 10*3/uL (ref 4.0–10.5)
nRBC: 0 % (ref 0.0–0.2)

## 2019-01-13 LAB — COMPREHENSIVE METABOLIC PANEL
ALT: 103 U/L — ABNORMAL HIGH (ref 0–44)
AST: 66 U/L — ABNORMAL HIGH (ref 15–41)
Albumin: 3.5 g/dL (ref 3.5–5.0)
Alkaline Phosphatase: 270 U/L — ABNORMAL HIGH (ref 38–126)
Anion gap: 9 (ref 5–15)
BUN: 7 mg/dL (ref 6–20)
CO2: 25 mmol/L (ref 22–32)
Calcium: 9.2 mg/dL (ref 8.9–10.3)
Chloride: 104 mmol/L (ref 98–111)
Creatinine, Ser: 0.52 mg/dL (ref 0.44–1.00)
GFR calc Af Amer: >60 mL/min (ref >60)
GFR calc non Af Amer: >60 mL/min (ref >60)
Glucose, Bld: 108 mg/dL — ABNORMAL HIGH (ref 70–99)
Potassium: 3.6 mmol/L (ref 3.5–5.1)
Sodium: 138 mmol/L (ref 135–145)
Total Bilirubin: 1.5 mg/dL — ABNORMAL HIGH (ref 0.3–1.2)
Total Protein: 7.2 g/dL (ref 6.5–8.1)

## 2019-01-13 SURGERY — LAPAROSCOPIC CHOLECYSTECTOMY
Anesthesia: General | Site: Abdomen

## 2019-01-13 MED ORDER — HEMOSTATIC AGENTS (NO CHARGE) OPTIME
TOPICAL | Status: DC | PRN
  Administered 2019-01-13 (×2): 1 via TOPICAL

## 2019-01-13 MED ORDER — KETOROLAC TROMETHAMINE 30 MG/ML IJ SOLN: 30.0000 mg | Freq: Four times a day (QID) | INTRAMUSCULAR | Status: DC | PRN

## 2019-01-13 MED ORDER — PROPOFOL 10 MG/ML IV BOLUS
INTRAVENOUS | Status: AC
  Filled 2019-01-13: qty 20

## 2019-01-13 MED ORDER — OXYCODONE-ACETAMINOPHEN 5-325 MG PO TABS: 1.0000 | ORAL_TABLET | ORAL | Status: DC | PRN

## 2019-01-13 MED ORDER — MIDAZOLAM HCL 2 MG/2ML IJ SOLN
INTRAMUSCULAR | Status: AC
  Filled 2019-01-13: qty 2

## 2019-01-13 MED ORDER — HYDROMORPHONE HCL 1 MG/ML IJ SOLN
0.2500 mg | INTRAMUSCULAR | Status: DC | PRN
  Administered 2019-01-13 (×3): 0.5 mg via INTRAVENOUS
  Filled 2019-01-13 (×3): qty 0.5

## 2019-01-13 MED ORDER — ONDANSETRON HCL 4 MG/2ML IJ SOLN
INTRAMUSCULAR | Status: AC
  Filled 2019-01-13: qty 2

## 2019-01-13 MED ORDER — KETOROLAC TROMETHAMINE 30 MG/ML IJ SOLN
30.0000 mg | Freq: Four times a day (QID) | INTRAMUSCULAR | Status: AC
  Administered 2019-01-13: 30 mg via INTRAVENOUS
  Filled 2019-01-13: qty 1

## 2019-01-13 MED ORDER — FENTANYL CITRATE (PF) 250 MCG/5ML IJ SOLN
INTRAMUSCULAR | Status: AC
  Filled 2019-01-13: qty 5

## 2019-01-13 MED ORDER — BUPIVACAINE LIPOSOME 1.3 % IJ SUSP
INTRAMUSCULAR | Status: AC
  Filled 2019-01-13: qty 20

## 2019-01-13 MED ORDER — ENOXAPARIN SODIUM 40 MG/0.4ML ~~LOC~~ SOLN: 40.0000 mg | SUBCUTANEOUS | Status: DC

## 2019-01-13 MED ORDER — FENTANYL CITRATE (PF) 100 MCG/2ML IJ SOLN
INTRAMUSCULAR | Status: DC | PRN
  Administered 2019-01-13: 50 ug via INTRAVENOUS
  Administered 2019-01-13: 100 ug via INTRAVENOUS
  Administered 2019-01-13 (×2): 50 ug via INTRAVENOUS

## 2019-01-13 MED ORDER — LACTATED RINGERS IV SOLN: INTRAVENOUS | Status: DC

## 2019-01-13 MED ORDER — OXYCODONE-ACETAMINOPHEN 7.5-325 MG PO TABS: 1.0000 | ORAL_TABLET | Freq: Four times a day (QID) | ORAL | 0 refills | Status: DC | PRN

## 2019-01-13 MED ORDER — SIMETHICONE 80 MG PO CHEW: 40.0000 mg | CHEWABLE_TABLET | Freq: Four times a day (QID) | ORAL | Status: DC | PRN

## 2019-01-13 MED ORDER — MIDAZOLAM HCL 2 MG/2ML IJ SOLN: 0.5000 mg | Freq: Once | INTRAMUSCULAR | Status: DC | PRN

## 2019-01-13 MED ORDER — BUPIVACAINE LIPOSOME 1.3 % IJ SUSP
INTRAMUSCULAR | Status: DC | PRN
  Administered 2019-01-13: 20 mL

## 2019-01-13 MED ORDER — 0.9 % SODIUM CHLORIDE (POUR BTL) OPTIME
TOPICAL | Status: DC | PRN
  Administered 2019-01-13: 1000 mL

## 2019-01-13 MED ORDER — ROCURONIUM BROMIDE 100 MG/10ML IV SOLN
INTRAVENOUS | Status: DC | PRN
  Administered 2019-01-13: 35 mg via INTRAVENOUS

## 2019-01-13 MED ORDER — PROPOFOL 10 MG/ML IV BOLUS
INTRAVENOUS | Status: DC | PRN
  Administered 2019-01-13: 50 mg via INTRAVENOUS
  Administered 2019-01-13: 150 mg via INTRAVENOUS
  Administered 2019-01-13: 50 mg via INTRAVENOUS

## 2019-01-13 MED ORDER — MIDAZOLAM HCL 5 MG/5ML IJ SOLN
INTRAMUSCULAR | Status: DC | PRN
  Administered 2019-01-13: 2 mg via INTRAVENOUS

## 2019-01-13 MED ORDER — HYDROCODONE-ACETAMINOPHEN 7.5-325 MG PO TABS: 1.0000 | ORAL_TABLET | Freq: Once | ORAL | Status: DC | PRN

## 2019-01-13 MED ORDER — SUGAMMADEX SODIUM 500 MG/5ML IV SOLN
INTRAVENOUS | Status: DC | PRN
  Administered 2019-01-13: 200 mg via INTRAVENOUS

## 2019-01-13 MED ORDER — DEXAMETHASONE SODIUM PHOSPHATE 10 MG/ML IJ SOLN
INTRAMUSCULAR | Status: DC | PRN
  Administered 2019-01-13: 8 mg via INTRAVENOUS

## 2019-01-13 MED ORDER — ONDANSETRON HCL 4 MG/2ML IJ SOLN
INTRAMUSCULAR | Status: DC | PRN
  Administered 2019-01-13: 4 mg via INTRAVENOUS

## 2019-01-13 MED ORDER — PROMETHAZINE HCL 25 MG/ML IJ SOLN
6.2500 mg | INTRAMUSCULAR | Status: DC | PRN
  Administered 2019-01-13: 12.5 mg via INTRAVENOUS
  Filled 2019-01-13: qty 1

## 2019-01-13 SURGICAL SUPPLY — 51 items
APPLICATOR ARISTA FLEXITIP XL (MISCELLANEOUS) ×2 IMPLANT
APPLIER CLIP ROT 10 11.4 M/L (STAPLE) ×3
BAG RETRIEVAL 10 (BASKET) ×1
BAG RETRIEVAL 10MM (BASKET) ×1
CHLORAPREP W/TINT 26 (MISCELLANEOUS) ×3 IMPLANT
CLIP APPLIE ROT 10 11.4 M/L (STAPLE) ×1 IMPLANT
CLOTH BEACON ORANGE TIMEOUT ST (SAFETY) ×3 IMPLANT
COVER LIGHT HANDLE STERIS (MISCELLANEOUS) ×6 IMPLANT
COVER WAND RF STERILE (DRAPES) ×3 IMPLANT
CUTTER FLEX LINEAR 45M (STAPLE) ×2 IMPLANT
DERMABOND ADVANCED (GAUZE/BANDAGES/DRESSINGS) ×2
DERMABOND ADVANCED .7 DNX12 (GAUZE/BANDAGES/DRESSINGS) ×1 IMPLANT
ELECT REM PT RETURN 9FT ADLT (ELECTROSURGICAL) ×3
ELECTRODE REM PT RTRN 9FT ADLT (ELECTROSURGICAL) ×1 IMPLANT
FILTER SMOKE EVAC LAPAROSHD (FILTER) ×3 IMPLANT
GLOVE BIOGEL PI IND STRL 7.0 (GLOVE) ×1 IMPLANT
GLOVE BIOGEL PI INDICATOR 7.0 (GLOVE) ×6
GLOVE ECLIPSE 6.5 STRL STRAW (GLOVE) ×4 IMPLANT
GLOVE SURG SS PI 7.5 STRL IVOR (GLOVE) ×3 IMPLANT
GOWN STRL REUS W/TWL LRG LVL3 (GOWN DISPOSABLE) ×9 IMPLANT
HEMOSTAT ARISTA ABSORB 3G PWDR (HEMOSTASIS) ×2 IMPLANT
HEMOSTAT SNOW SURGICEL 2X4 (HEMOSTASIS) ×3 IMPLANT
INST SET LAPROSCOPIC AP (KITS) ×3 IMPLANT
IV NS IRRIG 3000ML ARTHROMATIC (IV SOLUTION) IMPLANT
KIT TURNOVER KIT A (KITS) ×3 IMPLANT
MANIFOLD NEPTUNE II (INSTRUMENTS) ×3 IMPLANT
NDL HYPO 18GX1.5 BLUNT FILL (NEEDLE) ×1 IMPLANT
NDL INSUFFLATION 14GA 120MM (NEEDLE) ×1 IMPLANT
NEEDLE HYPO 18GX1.5 BLUNT FILL (NEEDLE) ×3 IMPLANT
NEEDLE HYPO 22GX1.5 SAFETY (NEEDLE) ×3 IMPLANT
NEEDLE INSUFFLATION 14GA 120MM (NEEDLE) ×3 IMPLANT
NS IRRIG 1000ML POUR BTL (IV SOLUTION) ×3 IMPLANT
PACK LAP CHOLE LZT030E (CUSTOM PROCEDURE TRAY) ×3 IMPLANT
PAD ARMBOARD 7.5X6 YLW CONV (MISCELLANEOUS) ×3 IMPLANT
RELOAD STAPLE 45 3.5 BLU ETS (ENDOMECHANICALS) IMPLANT
RELOAD STAPLE TA45 3.5 REG BLU (ENDOMECHANICALS) ×3 IMPLANT
SET BASIN LINEN APH (SET/KITS/TRAYS/PACK) ×3 IMPLANT
SET TUBE IRRIG SUCTION NO TIP (IRRIGATION / IRRIGATOR) IMPLANT
SLEEVE ENDOPATH XCEL 5M (ENDOMECHANICALS) ×3 IMPLANT
SUT MNCRL AB 4-0 PS2 18 (SUTURE) ×7 IMPLANT
SUT VICRYL 0 UR6 27IN ABS (SUTURE) ×3 IMPLANT
SYR 20CC LL (SYRINGE) ×3 IMPLANT
SYS BAG RETRIEVAL 10MM (BASKET) ×1
SYSTEM BAG RETRIEVAL 10MM (BASKET) ×1 IMPLANT
TROCAR ENDO BLADELESS 11MM (ENDOMECHANICALS) ×3 IMPLANT
TROCAR XCEL NON-BLD 5MMX100MML (ENDOMECHANICALS) ×3 IMPLANT
TROCAR XCEL UNIV SLVE 11M 100M (ENDOMECHANICALS) ×3 IMPLANT
TUBE CONNECTING 12'X1/4 (SUCTIONS) ×1
TUBE CONNECTING 12X1/4 (SUCTIONS) ×2 IMPLANT
TUBING INSUFFLATION (TUBING) ×3 IMPLANT
WARMER LAPAROSCOPE (MISCELLANEOUS) ×3 IMPLANT

## 2019-01-13 NOTE — Op Note (Signed)
Patient:  Carlina Derks  DOB:  [birth year]  MRN:  [id number]   Preop Diagnosis: Gallstone pancreatitis  Postop Diagnosis: Same  Procedure: Laparoscopic cholecystectomy  Surgeon: Aviva Signs, MD  Anes: General endotracheal  Indications: Patient is a 28 year old white female who presented to [facility] with gallstone pancreatitis.  She underwent ERCP with stone extraction 3 days ago and now presents for laparoscopic cholecystectomy.  The risks and benefits of the procedure including bleeding, infection, hepatobiliary injury, and the possibility of an open procedure were fully explained to the patient, who gave informed consent.  Procedure note: The patient was placed in supine position.  After induction of general endotracheal anesthesia, the abdomen was prepped and draped using the usual sterile technique with ChloraPrep.  Surgical site confirmation was performed.  An infraumbilical incision was made down to the fascia.  A Veress needle was introduced into the abdominal cavity and confirmation of placement was done using the saline drop test.  The abdomen was then insufflated to 15 mmHg pressure.  An 11 mm trocar was introduced into the abdominal cavity under direct visualization without difficulty.  The patient was placed in reverse Trendelenburg position and an additional 11 mm trocar was placed in the epigastric region and 5 mm trochars were placed the right upper quadrant and right flank regions.  The liver was inspected and noted to be within normal limits.  The gallbladder was noted to be edematous with a thickened gallbladder wall.  The gallbladder was retracted in a dynamic fashion in order to provide a critical view of the triangle of Calot.  The cystic duct was first identified.  Its juncture to the infundibulum was fully identified.  Due to wall edema, it was ligated and divided using a standard Endo GIA.  The cystic artery was ligated and divided using clips.  The  gallbladder was freed away from the gallbladder fossa using Bovie electrocautery.  The gallbladder was delivered through the epigastric trocar site using an Endo Catch bag.  The gallbladder fossa was inspected and no abnormal bleeding or bile leakage was noted.  Arista and Surgicel were placed in the gallbladder fossa.  All fluid and air were then evacuated from the abdominal cavity prior to the removal of the trochars.  All wounds were irrigated with normal saline.  All wounds were injected with Exparel.  The epigastric fascia as well as infraumbilical fascia was reapproximated using an 0 Vicryl interrupted suture.  All skin incisions were closed using a 4-0 Monocryl subcuticular suture.  Dermabond was applied.  All tape and needle counts were correct at the end of the procedure.  The patient was extubated in the operating room and transferred to PACU in stable condition.  Complications: None  EBL: Minimal  Specimen: Gallbladder

## 2019-01-13 NOTE — Discharge Summary (Signed)
Physician Discharge Summary  Patient ID: Sonya Mcdonald MRN: [id number] DOB/AGE: 21-Jan-1991 28 y.o.  Admit date: 01/09/2019 Discharge date: 01/13/2019  Admission Diagnoses: Gallstone pancreatitis, choledocholithiasis  Discharge Diagnoses: Same Active Problems:   Acute cholecystitis   Discharged Condition: good  Hospital Course: Patient is a 28 year old white female who presented to the emergency room with worsening right upper quadrant abdominal pain.  She was found to have gallstone pancreatitis as well as choledocholithiasis.  She was admitted to the hospital on 01/09/2019 for further evaluation and treatment.  On 01/10/2019, she underwent an ERCP with stone extraction by Dr. Laural Golden of gastroenterology.  She tolerated the procedure well.  She subsequently underwent laparoscopic cholecystectomy on 01/13/2019 by Dr. Arnoldo Morale.  She tolerated that surgery well.  Her diet was advanced at difficulty.  The patient is being discharged home on 01/13/2019 in good and improving condition.  Consults: GI and general surgery   Treatments: surgery: ERCP with stone extraction on 01/10/2019 Laparoscopic cholecystectomy on 01/13/2019  Discharge Exam: Blood pressure 117/78, pulse (!) 58, temperature 97.6 F (36.4 C), temperature source Oral, resp. rate 17, height 5\' 7"  (1.702 m), weight 88.9 kg, last menstrual period 12/30/2018, SpO2 97 %. General appearance: alert, cooperative and no distress Resp: clear to auscultation bilaterally Cardio: regular rate and rhythm, S1, S2 normal, no murmur, click, rub or gallop GI: Soft, incisions healing well.  Disposition: Discharge disposition: 01-Home or Self Care       Discharge Instructions    Diet - low sodium heart healthy   Complete by: As directed    Increase activity slowly   Complete by: As directed      Allergies as of 01/13/2019   Not on File     Medication List    TAKE these medications   oxyCODONE-acetaminophen 7.5-325 MG tablet Commonly  known as: Percocet Take 1 tablet by mouth every 6 (six) hours as needed.   Sprintec 28 0.25-35 MG-MCG tablet Generic drug: norgestimate-ethinyl estradiol Take 1 tablet by mouth daily.      Follow-up Information    Aviva Signs, MD Follow up.   Specialty: General Surgery Why: Will call you next week for follow up visit Contact information: [address] [place] [address] [phone]           Signed: Aviva Signs 01/13/2019, 11:01 AM

## 2019-01-13 NOTE — Transfer of Care (Signed)
Immediate Anesthesia Transfer of Care Note  Patient: Sonya Mcdonald  Procedure(s) Performed: LAPAROSCOPIC CHOLECYSTECTOMY (N/A Abdomen)  Patient Location: PACU  Anesthesia Type:General  Level of Consciousness: awake, alert  and patient cooperative  Airway & Oxygen Therapy: Patient Spontanous Breathing  Post-op Assessment: Report given to RN and Post -op Vital signs reviewed and stable  Post vital signs: Reviewed and stable  Last Vitals:  Vitals Value Taken Time  BP 150/101 01/13/19 1100  Temp 36.7 C 01/13/19 1059  Pulse 58 01/13/19 1102  Resp 14 01/13/19 1102  SpO2 100 % 01/13/19 1102  Vitals shown include unvalidated device data.  Last Pain:  Vitals:   01/13/19 0917  TempSrc: Oral  PainSc: 0-No pain      Patients Stated Pain Goal: 4 (07/04/30 3557)  Complications: No apparent anesthesia complications

## 2019-01-13 NOTE — Anesthesia Postprocedure Evaluation (Signed)
Anesthesia Post Note  Patient: Sonya Mcdonald  Procedure(s) Performed: LAPAROSCOPIC CHOLECYSTECTOMY (N/A Abdomen)  Patient location during evaluation: PACU Anesthesia Type: General Level of consciousness: awake and alert and patient cooperative Pain management: satisfactory to patient Vital Signs Assessment: post-procedure vital signs reviewed and stable Respiratory status: spontaneous breathing Cardiovascular status: stable Postop Assessment: no apparent nausea or vomiting Anesthetic complications: no     Last Vitals:  Vitals:   01/13/19 1059 01/13/19 1115  BP: (!) 145/89 132/90  Pulse: (!) 57 (!) 54  Resp: 15 20  Temp: 36.7 C   SpO2: 96% 99%    Last Pain:  Vitals:   01/13/19 1120  TempSrc:   PainSc: Asleep                 Merlen Gurry

## 2019-01-13 NOTE — Progress Notes (Signed)
Nsg Discharge Note  Admit Date:  01/09/2019 Discharge date: 01/13/2019   Aricela Bertagnolli to be D/C'd Home per MD order.  AVS completed.   Patient able to verbalize understanding.  Discharge Medication: Allergies as of 01/13/2019   Not on File     Medication List    TAKE these medications   oxyCODONE-acetaminophen 7.5-325 MG tablet Commonly known as: Percocet Take 1 tablet by mouth every 6 (six) hours as needed.   Sprintec 28 0.25-35 MG-MCG tablet Generic drug: norgestimate-ethinyl estradiol Take 1 tablet by mouth daily.       Discharge Assessment: Vitals:   01/13/19 1144 01/13/19 1511  BP: (!) 131/91 122/74  Pulse: 62 60  Resp: 20 16  Temp:  98.3 F (36.8 C)  SpO2: 95% 94%   Skin clean, dry and intact without evidence of skin break down, no evidence of skin tears noted. IV catheter discontinued intact. Site without signs and symptoms of complications - no redness or edema noted at insertion site, patient denies c/o pain - only slight tenderness at site.  Dressing with slight pressure applied.  D/c Instructions-Education: Discharge instructions given to patient/family with verbalized understanding. D/c education completed with patient including follow up instructions, medication list, d/c activities limitations if indicated, with other d/c instructions as indicated by MD - patient able to verbalize understanding, all questions fully answered. Patient instructed to return to ED, call 911, or call MD for any changes in condition.  Patient escorted via [place], and D/C home via private auto.  Isatou Agredano Loletha Grayer, RN 01/13/2019 3:23 PM

## 2019-01-13 NOTE — Interval H&P Note (Signed)
History and Physical Interval Note:  01/13/2019 8:46 AM  Sonya Mcdonald  has presented today for surgery, with the diagnosis of gallstone pancreatitis.  The various methods of treatment have been discussed with the patient and family. After consideration of risks, benefits and other options for treatment, the patient has consented to  Procedure(s): LAPAROSCOPIC CHOLECYSTECTOMY (N/A) as a surgical intervention.  The patient's history has been reviewed, patient examined, no change in status, stable for surgery.  I have reviewed the patient's chart and labs.  Questions were answered to the patient's satisfaction.     Aviva Signs

## 2019-01-13 NOTE — Anesthesia Preprocedure Evaluation (Signed)
Anesthesia Evaluation  Patient identified by MRN, date of birth, ID band Patient awake    Reviewed: Allergy & Precautions, NPO status , Patient's Chart, lab work & pertinent test results  Airway Mallampati: II  TM Distance: >3 FB Neck ROM: Full    Dental no notable dental hx. (+) Teeth Intact   Pulmonary neg pulmonary ROS,    Pulmonary exam normal breath sounds clear to auscultation       Cardiovascular Exercise Tolerance: Good negative cardio ROS Normal cardiovascular examI Rhythm:Regular Rate:Normal     Neuro/Psych negative neurological ROS  negative psych ROS   GI/Hepatic negative GI ROS, Neg liver ROS,   Endo/Other  negative endocrine ROS  Renal/GU negative Renal ROS  negative genitourinary   Musculoskeletal negative musculoskeletal ROS (+)   Abdominal   Peds negative pediatric ROS (+)  Hematology negative hematology ROS (+)   Anesthesia Other Findings   Reproductive/Obstetrics negative OB ROS                             Anesthesia Physical Anesthesia Plan  ASA: I  Anesthesia Plan: General   Post-op Pain Management:    Induction: Intravenous  PONV Risk Score and Plan: 3 and Treatment may vary due to age or medical condition, Dexamethasone and Ondansetron  Airway Management Planned: Oral ETT  Additional Equipment:   Intra-op Plan:   Post-operative Plan: Extubation in OR  Informed Consent: I have reviewed the patients History and Physical, chart, labs and discussed the procedure including the risks, benefits and alternatives for the proposed anesthesia with the patient or authorized representative who has indicated his/her understanding and acceptance.     Dental advisory given  Plan Discussed with: CRNA  Anesthesia Plan Comments: (Plan Full PPE use  Plan GETA -wtp with same after Q&A S/p ERCP with GETA on 01/10/19 without issues )        Anesthesia Quick  Evaluation

## 2019-01-13 NOTE — Anesthesia Procedure Notes (Signed)
Procedure Name: Intubation Date/Time: 01/13/2019 9:58 AM Performed by: Vista Deck, CRNA Pre-anesthesia Checklist: Patient identified, Patient being monitored, Timeout performed, Emergency Drugs available and Suction available Patient Re-evaluated:Patient Re-evaluated prior to induction Oxygen Delivery Method: Circle System Utilized Preoxygenation: Pre-oxygenation with 100% oxygen Induction Type: IV induction Ventilation: Mask ventilation without difficulty Laryngoscope Size: Mac and 3 Grade View: Grade I Tube type: Oral Tube size: 7.0 mm Number of attempts: 1 Airway Equipment and Method: stylet and Oral airway Placement Confirmation: ETT inserted through vocal cords under direct vision,  positive ETCO2 and breath sounds checked- equal and bilateral Secured at: 21 cm Tube secured with: Tape Dental Injury: Teeth and Oropharynx as per pre-operative assessment

## 2019-01-13 NOTE — Discharge Instructions (Signed)
Laparoscopic Cholecystectomy, Care After This sheet gives you information about how to care for yourself after your procedure. Your health care provider may also give you more specific instructions. If you have problems or questions, contact your health care provider. What can I expect after the procedure? After the procedure, it is common to have:  Pain at your incision sites. You will be given medicines to control this pain.  Mild nausea or vomiting.  Bloating and possible shoulder pain from the air-like gas that was used during the procedure. Follow these instructions at home: Incision care   Follow instructions from your health care provider about how to take care of your incisions. Make sure you: ? Wash your hands with soap and water before you change your bandage (dressing). If soap and water are not available, use hand sanitizer. ? Change your dressing as told by your health care provider. ? Leave stitches (sutures), skin glue, or adhesive strips in place. These skin closures may need to be in place for 2 weeks or longer. If adhesive strip edges start to loosen and curl up, you may trim the loose edges. Do not remove adhesive strips completely unless your health care provider tells you to do that.  Do not take baths, swim, or use a hot tub until your health care provider approves. Ask your health care provider if you can take showers. You may only be allowed to take sponge baths for bathing.  Check your incision area every day for signs of infection. Check for: ? More redness, swelling, or pain. ? More fluid or blood. ? Warmth. ? Pus or a bad smell. Activity  Do not drive or use heavy machinery while taking prescription pain medicine.  Do not lift anything that is heavier than 10 lb (4.5 kg) until your health care provider approves.  Do not play contact sports until your health care provider approves.  Do not drive for 24 hours if you were given a medicine to help you relax  (sedative).  Rest as needed. Do not return to work or school until your health care provider approves. General instructions  Take over-the-counter and prescription medicines only as told by your health care provider.  To prevent or treat constipation while you are taking prescription pain medicine, your health care provider may recommend that you: ? Drink enough fluid to keep your urine clear or pale yellow. ? Take over-the-counter or prescription medicines. ? Eat foods that are high in fiber, such as fresh fruits and vegetables, whole grains, and beans. ? Limit foods that are high in fat and processed sugars, such as fried and sweet foods. Contact a health care provider if:  You develop a rash.  You have more redness, swelling, or pain around your incisions.  You have more fluid or blood coming from your incisions.  Your incisions feel warm to the touch.  You have pus or a bad smell coming from your incisions.  You have a fever.  One or more of your incisions breaks open. Get help right away if:  You have trouble breathing.  You have chest pain.  You have increasing pain in your shoulders.  You faint or feel dizzy when you stand.  You have severe pain in your abdomen.  You have nausea or vomiting that lasts for more than one day.  You have leg pain. This information is not intended to replace advice given to you by your health care provider. Make sure you discuss any questions you have   with your health care provider. Document Released: 06/12/2005 Document Revised: 05/25/2017 Document Reviewed: 11/29/2015 Elsevier Patient Education  2020 [employer]  

## 2019-01-14 ENCOUNTER — Other Ambulatory Visit (INDEPENDENT_AMBULATORY_CARE_PROVIDER_SITE_OTHER): Payer: Self-pay | Admitting: General Surgery

## 2019-01-14 ENCOUNTER — Telehealth (INDEPENDENT_AMBULATORY_CARE_PROVIDER_SITE_OTHER): Payer: Self-pay

## 2019-01-14 ENCOUNTER — Encounter (HOSPITAL_COMMUNITY): Payer: Self-pay | Admitting: General Surgery

## 2019-01-14 DIAGNOSIS — Z09 Encounter for follow-up examination after completed treatment for conditions other than malignant neoplasm: Secondary | ICD-10-CM

## 2019-01-14 MED ORDER — HYDROCODONE-ACETAMINOPHEN 5-325 MG PO TABS: 1.0000 | ORAL_TABLET | ORAL | 0 refills | Status: DC | PRN

## 2019-01-14 NOTE — Telephone Encounter (Signed)
Patients husband, Broadus John, called and states that patient is taking the pain meds the were prescribed and they are making her nauseous. Will notify MD, patient aware.

## 2019-01-17 ENCOUNTER — Encounter (HOSPITAL_COMMUNITY): Payer: Self-pay | Admitting: General Surgery

## 2019-01-21 ENCOUNTER — Telehealth (INDEPENDENT_AMBULATORY_CARE_PROVIDER_SITE_OTHER): Payer: Self-pay | Admitting: General Surgery

## 2019-01-21 ENCOUNTER — Telehealth: Payer: Self-pay | Admitting: General Surgery

## 2019-01-21 ENCOUNTER — Other Ambulatory Visit: Payer: Self-pay

## 2019-01-21 DIAGNOSIS — Z09 Encounter for follow-up examination after completed treatment for conditions other than malignant neoplasm: Secondary | ICD-10-CM

## 2019-01-21 NOTE — Telephone Encounter (Signed)
Attempted to call patient, left message.  

## 2019-01-21 NOTE — Telephone Encounter (Signed)
Virtual telephone visit performed.  Patient doing well from surgery.  States she is having no problems.  Her incisions are healing well.  She is already back to work.  She was instructed to call me should any problems arise.

## 2019-05-01 ENCOUNTER — Other Ambulatory Visit: Payer: Self-pay

## 2019-05-01 ENCOUNTER — Ambulatory Visit
Admission: EM | Admit: 2019-05-01 | Discharge: 2019-05-01 | Disposition: A | Discharge status: Home / Self Care | Payer: BC Managed Care – PPO | Attending: Emergency Medicine | Admitting: Emergency Medicine

## 2019-05-01 DIAGNOSIS — N898 Other specified noninflammatory disorders of vagina: Secondary | ICD-10-CM | POA: Diagnosis not present

## 2019-05-01 DIAGNOSIS — R300 Dysuria: Secondary | ICD-10-CM | POA: Diagnosis not present

## 2019-05-01 DIAGNOSIS — N760 Acute vaginitis: Secondary | ICD-10-CM | POA: Diagnosis not present

## 2019-05-01 LAB — POCT URINALYSIS DIP (MANUAL ENTRY)
Bilirubin, UA: NEGATIVE
Glucose, UA: NEGATIVE mg/dL
Ketones, POC UA: NEGATIVE mg/dL
Nitrite, UA: NEGATIVE
Protein Ur, POC: NEGATIVE mg/dL
Spec Grav, UA: 1.025 (ref 1.010–1.025)
Urobilinogen, UA: 0.2 E.U./dL
pH, UA: 6 (ref 5.0–8.0)

## 2019-05-01 LAB — POCT URINE PREGNANCY: Preg Test, Ur: NEGATIVE

## 2019-05-01 MED ORDER — METRONIDAZOLE 500 MG PO TABS: 500.0000 mg | ORAL_TABLET | Freq: Two times a day (BID) | ORAL | 0 refills | Status: DC

## 2019-05-01 NOTE — Discharge Instructions (Signed)
Vaginal self-swab obtained.  We will follow up with you regarding abnormal results Prescribed metronidazole 500 mg twice daily for 7 days (do not take while consuming alcohol and/or if breastfeeding) Follow up with PCP as needed Return here or go to ER if you have any new or worsening symptoms fever, chills, nausea, vomiting, abdominal or pelvic pain, painful intercourse, vaginal discharge, vaginal bleeding, persistent symptoms despite treatment, etc... 

## 2019-05-01 NOTE — ED Provider Notes (Signed)
[facility]   [id number] 05/01/19 Arrival Time: 1053   QB:HALPFXT itching and irritation  SUBJECTIVE:  Sonya Mcdonald is a 28 y.o. female who presents with complaints of vaginal itching and irritation x 3 days.  Does admit to changing soaps recently, and just started menstrual cycle today.  Denies recent sexual encounter or antibiotic use.  Patient has been sexually active with 1 female partner for the past 4 years. Last sex 2 weeks ago.  Partner without symptoms.  She has NOT tried OTC medications.  Denies aggravating factors.  She reports similar symptoms in the past and diagnosed with BV.  Complains of associated vaginal odor.  She denies fever, chills, nausea, vomiting, abdominal or pelvic pain, urinary symptoms, dyspareunia, vaginal rashes or lesions, vaginal discharge.   Patient's last menstrual period was 05/01/2019 (exact date). Current birth control method: None  ROS: As per HPI.  All other pertinent ROS negative.     Past Medical History:  Diagnosis Date  . Medical history non-contributory    Past Surgical History:  Procedure Laterality Date  . CHOLECYSTECTOMY N/A 01/13/2019   Procedure: LAPAROSCOPIC CHOLECYSTECTOMY;  Surgeon: Franky Macho, MD;  Location: [facility];  Service: General;  Laterality: N/A;  . ERCP N/A 01/10/2019   Procedure: ENDOSCOPIC RETROGRADE CHOLANGIOPANCREATOGRAPHY (ERCP);  Surgeon: Malissa Hippo, MD;  Location: [facility];  Service: Gastroenterology;  Laterality: N/A;  . NO PAST SURGERIES    . REMOVAL OF STONES N/A 01/10/2019   Procedure: BALLOON DILATATION AND STONE EXTRACTION;  Surgeon: Malissa Hippo, MD;  Location: [facility];  Service: Gastroenterology;  Laterality: N/A;  . SPHINCTEROTOMY N/A 01/10/2019   Procedure: SPHINCTEROTOMY;  Surgeon: Malissa Hippo, MD;  Location: [facility];  Service: Gastroenterology;  Laterality: N/A;   No Known Allergies No current facility-administered medications on file prior to encounter.     Current Outpatient Medications on File Prior to Encounter  Medication Sig Dispense Refill  . [DISCONTINUED] norgestimate-ethinyl estradiol (SPRINTEC 28) 0.25-35 MG-MCG tablet Take 1 tablet by mouth daily.       Social History   Socioeconomic History  . Marital status: Single    Spouse name: Not on file  . Number of children: Not on file  . Years of education: Not on file  . Highest education level: Not on file  Occupational History  . Not on file  Social Needs  . Financial resource strain: Not on file  . Food insecurity    Worry: Not on file    Inability: Not on file  . Transportation needs    Medical: Not on file    Non-medical: Not on file  Tobacco Use  . Smoking status: Never Smoker  . Smokeless tobacco: Never Used  Substance and Sexual Activity  . Alcohol use: Yes    Comment: occ  . Drug use: Never  . Sexual activity: Yes    Birth control/protection: Pill  Lifestyle  . Physical activity    Days per week: Not on file    Minutes per session: Not on file  . Stress: Not on file  Relationships  . Social Musician on phone: Not on file    Gets together: Not on file    Attends religious service: Not on file    Active member of club or organization: Not on file    Attends meetings of clubs or organizations: Not on file    Relationship status: Not on file  . Intimate partner violence  Fear of current or ex partner: Not on file    Emotionally abused: Not on file    Physically abused: Not on file    Forced sexual activity: Not on file  Other Topics Concern  . Not on file  Social History Narrative  . Not on file   Family History  Problem Relation Age of Onset  . Seizures Mother     OBJECTIVE:  Vitals:   05/01/19 1105  BP: 131/87  Pulse: 82  Resp: 18  Temp: 98.1 F (36.7 C)  SpO2: 97%     General appearance: Alert, NAD, appears stated age Head: NCAT Throat: lips, mucosa, and tongue normal; teeth and gums normal Lungs: CTA bilaterally  without adventitious breath sounds Heart: regular rate and rhythm.   Back: no CVA tenderness Abdomen: soft, non-tender; bowel sounds normal; no guarding GU: deferred; Vaginal swab obtained Skin: warm and dry Psychological:  Alert and cooperative. Normal mood and affect.  LABS:  Results for orders placed or performed during the hospital encounter of 05/01/19  POCT urinalysis dipstick  Result Value Ref Range   Color, UA yellow yellow   Clarity, UA clear clear   Glucose, UA negative negative mg/dL   Bilirubin, UA negative negative   Ketones, POC UA negative negative mg/dL   Spec Grav, UA 1.025 1.010 - 1.025   Blood, UA large (A) negative   pH, UA 6.0 5.0 - 8.0   Protein Ur, POC negative negative mg/dL   Urobilinogen, UA 0.2 0.2 or 1.0 E.U./dL   Nitrite, UA Negative Negative   Leukocytes, UA Small (1+) (A) Negative  POCT urine pregnancy  Result Value Ref Range   Preg Test, Ur Negative Negative   ASSESSMENT & PLAN:  1. Dysuria   2. Vaginal itching   3. Vaginitis and vulvovaginitis     Meds ordered this encounter  Medications  . metroNIDAZOLE (FLAGYL) 500 MG tablet    Sig: Take 1 tablet (500 mg total) by mouth 2 (two) times daily.    Dispense:  14 tablet    Refill:  0    Order Specific Question:   Supervising Provider    Answer:   Raylene Everts [[id number]]    Pending: Labs Reviewed  POCT URINALYSIS DIP (MANUAL ENTRY) - Abnormal; Notable for the following components:      Result Value   Blood, UA large (*)    Leukocytes, UA Small (1+) (*)    All other components within normal limits  URINE CULTURE  POCT URINE PREGNANCY  CERVICOVAGINAL ANCILLARY ONLY   Urine culture sent Vaginal self-swab obtained.  We will follow up with you regarding abnormal results Prescribed metronidazole 500 mg twice daily for 7 days (do not take while consuming alcohol and/or if breastfeeding) Follow up with PCP as needed Return here or go to ER if you have any new or worsening symptoms  fever, chills, nausea, vomiting, abdominal or pelvic pain, painful intercourse, vaginal discharge, vaginal bleeding, persistent symptoms despite treatment, etc...  Reviewed expectations re: course of current medical issues. Questions answered. Outlined signs and symptoms indicating need for more acute intervention. Patient verbalized understanding. After Visit Summary given.       Lestine Box, PA-C 05/01/19 1149

## 2019-05-01 NOTE — ED Triage Notes (Signed)
Pt presents with c/o external itching of that began 3 days ago, also redness, denies discharge

## 2019-05-03 LAB — URINE CULTURE: Culture: 10000 — AB

## 2019-05-05 LAB — CERVICOVAGINAL ANCILLARY ONLY
Bacterial vaginitis: NEGATIVE
Candida vaginitis: POSITIVE — AB
Chlamydia: NEGATIVE
Neisseria Gonorrhea: NEGATIVE
Trichomonas: NEGATIVE

## 2019-05-06 ENCOUNTER — Telehealth (HOSPITAL_COMMUNITY): Payer: Self-pay | Admitting: Emergency Medicine

## 2019-05-06 ENCOUNTER — Encounter (HOSPITAL_COMMUNITY): Payer: Self-pay

## 2019-05-06 MED ORDER — FLUCONAZOLE 150 MG PO TABS: 150.0000 mg | ORAL_TABLET | Freq: Once | ORAL | 0 refills | Status: AC

## 2019-05-06 NOTE — Telephone Encounter (Signed)
Test for candida (yeast) was positive.  Prescription for fluconazole 150mg  po now, repeat dose in 3d if needed, #2 no refills, sent to the pharmacy of record.  Recheck or followup with PCP for further evaluation if symptoms are not improving.    Attempted to reach patient. No answer at this time. Voicemail left.  Mychart message sent

## 2019-06-06 DIAGNOSIS — F419 Anxiety disorder, unspecified: Secondary | ICD-10-CM | POA: Diagnosis not present

## 2019-06-06 DIAGNOSIS — Z6834 Body mass index (BMI) 34.0-34.9, adult: Secondary | ICD-10-CM | POA: Diagnosis not present

## 2019-07-04 DIAGNOSIS — F419 Anxiety disorder, unspecified: Secondary | ICD-10-CM | POA: Diagnosis not present

## 2019-10-16 DIAGNOSIS — Z3201 Encounter for pregnancy test, result positive: Secondary | ICD-10-CM | POA: Diagnosis not present

## 2019-10-16 DIAGNOSIS — Z6836 Body mass index (BMI) 36.0-36.9, adult: Secondary | ICD-10-CM | POA: Diagnosis not present

## 2019-10-16 DIAGNOSIS — N912 Amenorrhea, unspecified: Secondary | ICD-10-CM | POA: Diagnosis not present

## 2019-12-10 DIAGNOSIS — O2342 Unspecified infection of urinary tract in pregnancy, second trimester: Secondary | ICD-10-CM | POA: Diagnosis not present

## 2019-12-10 DIAGNOSIS — Z3689 Encounter for other specified antenatal screening: Secondary | ICD-10-CM | POA: Diagnosis not present

## 2020-07-25 IMAGING — US ULTRASOUND ABDOMEN LIMITED
1 series · 13 of 25 positions shown · non-contrast
Comparison: None.

CLINICAL DATA: Jaundice with right upper quadrant pain

EXAM:
ULTRASOUND ABDOMEN LIMITED RIGHT UPPER QUADRANT

[Series 1: ultrasound abdomen limited · 0.16mm/px · 13 of 61 slices shown]
[im 1/61]
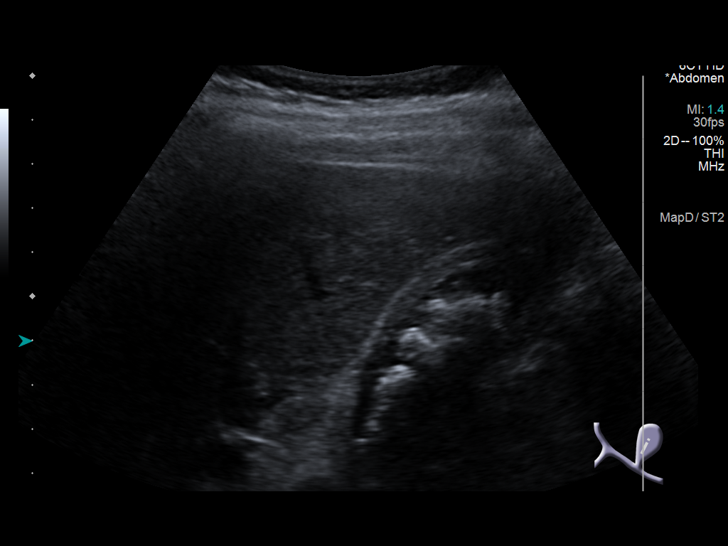
[im 6/61]
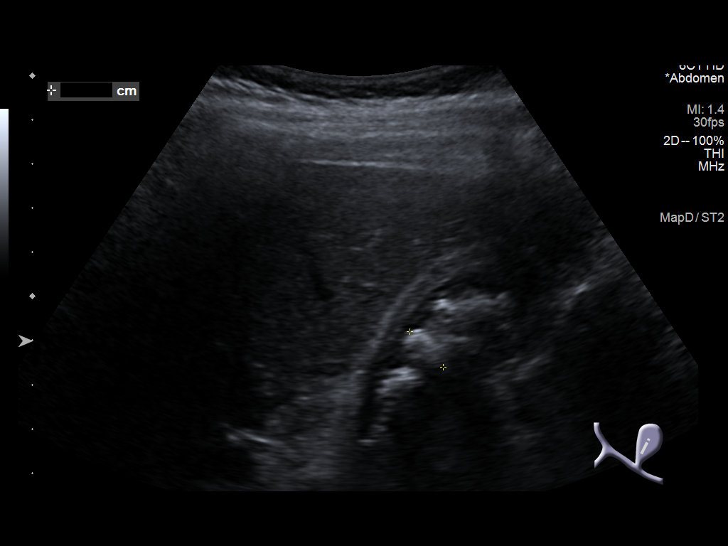
[im 11/61]
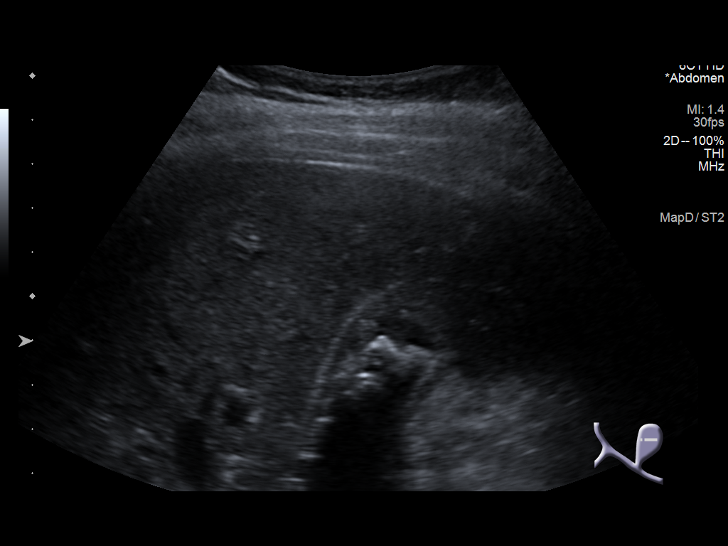
[im 16/61]
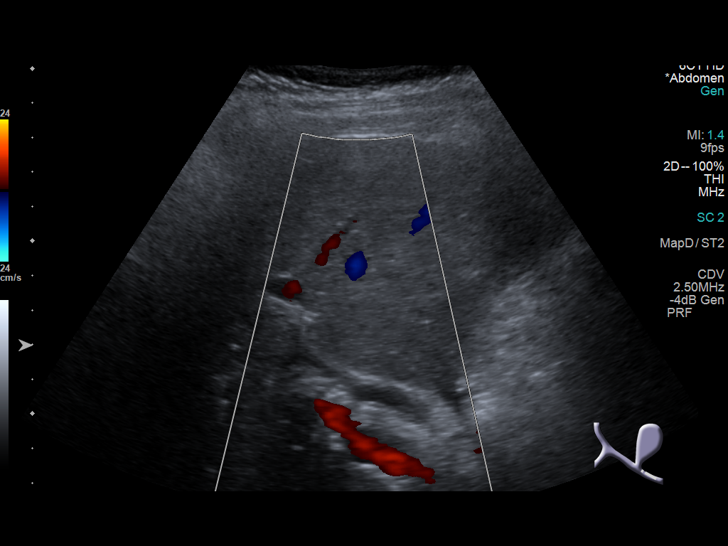
[im 21/61]
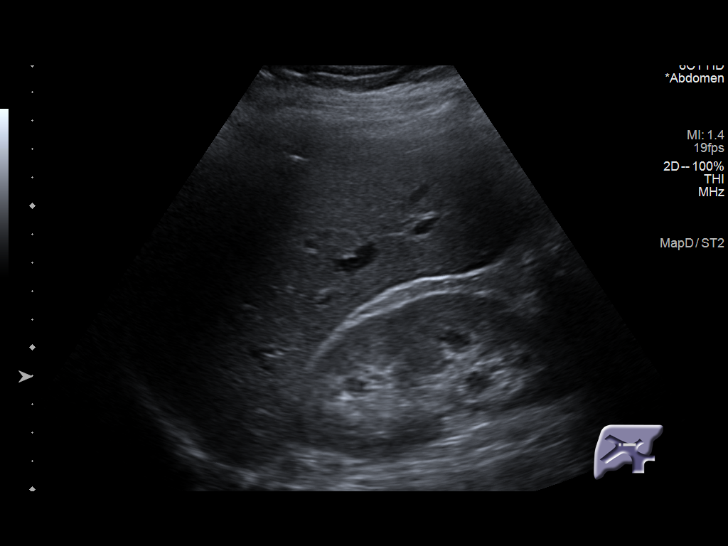
[im 26/61]
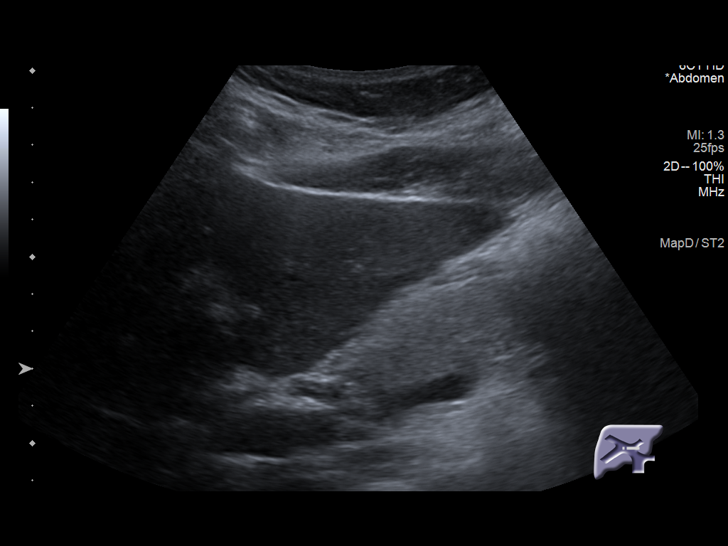
[im 31/61]
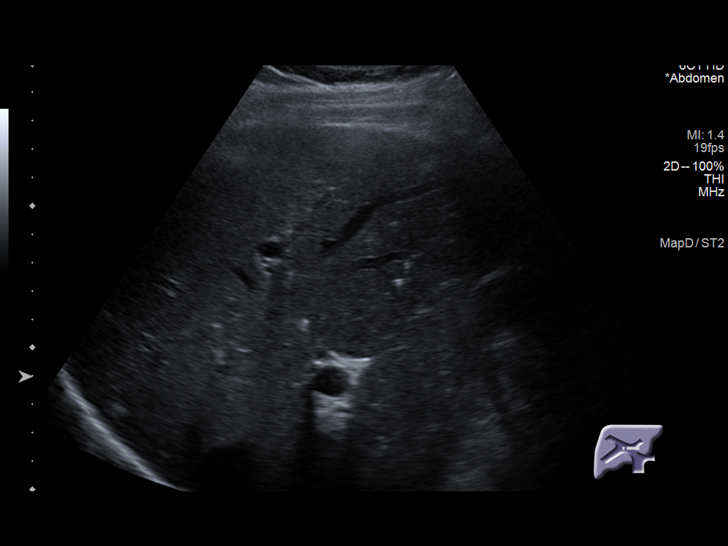
[im 36/61]
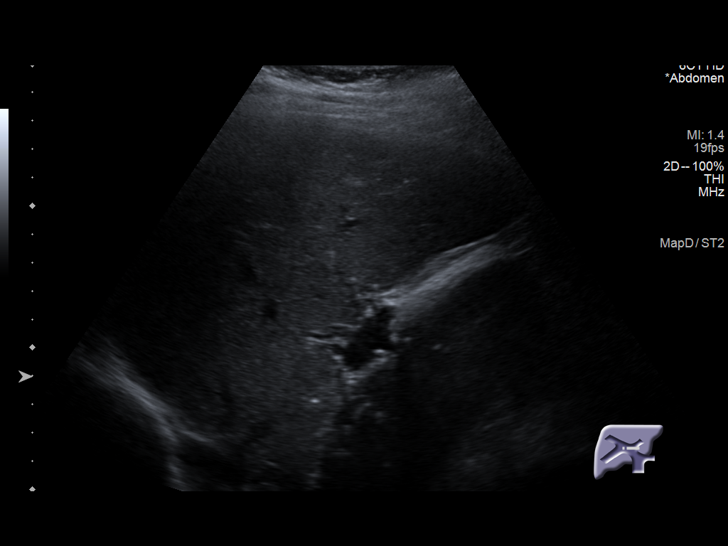
[im 41/61]
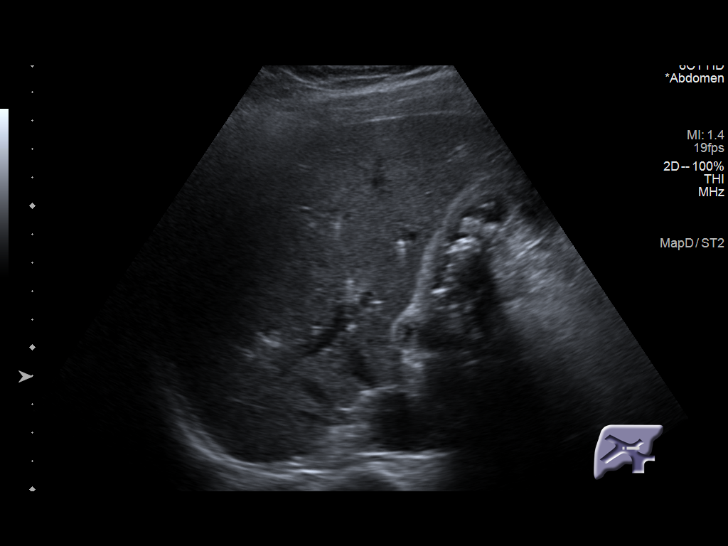
[im 46/61]
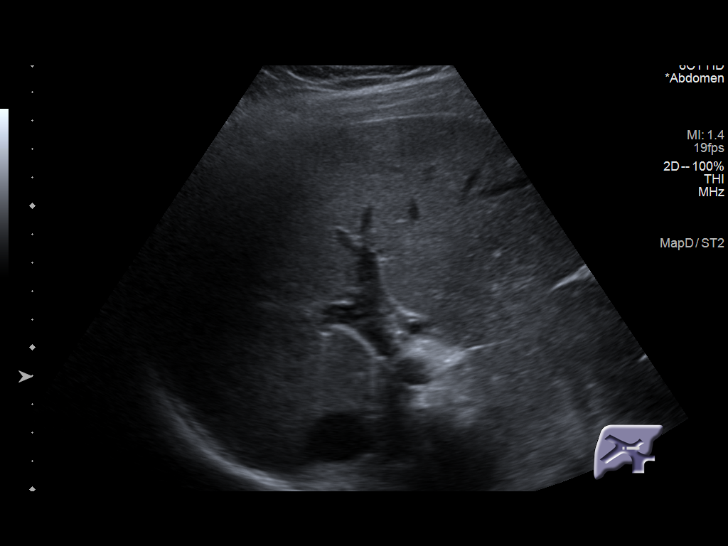
[im 51/61]
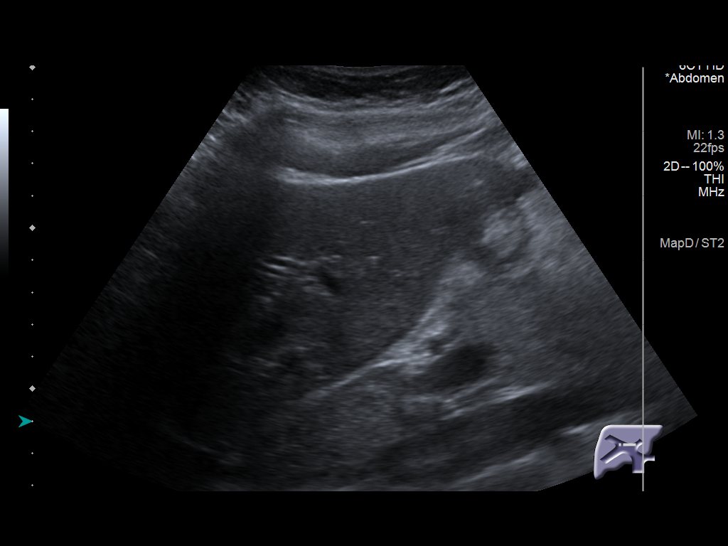
[im 56/61]
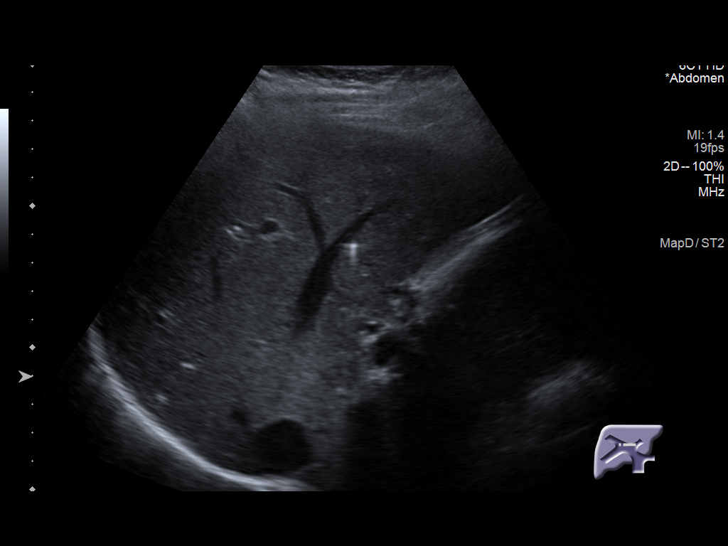
[im 61/61]
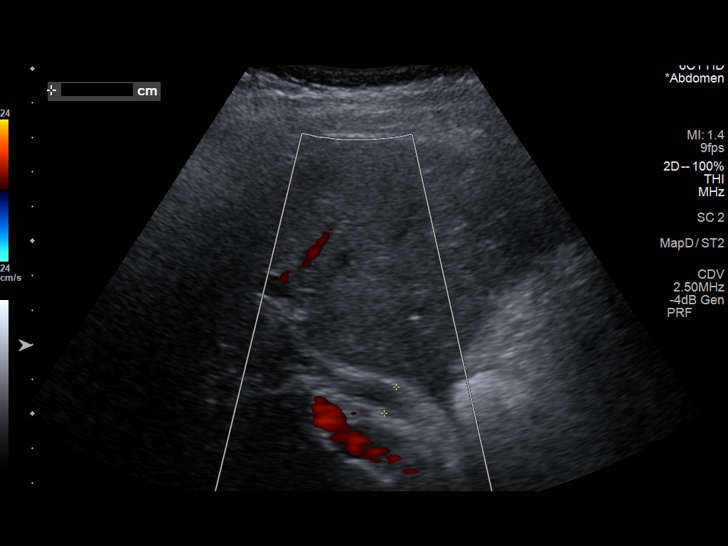

[13 of 25 positions shown; findings below may reference images not displayed]

FINDINGS: Gallbladder:

Within the gallbladder, there are multiple echogenic foci which move
and shadow consistent with cholelithiasis. Largest gallstone
measures 1.1 cm in length. The gallbladder wall appears mildly
thickened and slightly edematous. There is no pericholecystic fluid.
No sonographic Murphy sign noted by sonographer.

Common bile duct:

Diameter: 9 mm which is prominent. Note that portions of the distal
common bile duct are obscured by gas. There is questionable focus of
air within the intrahepatic biliary ductal system.

Liver:

No focal lesion identified. Within normal limits in parenchymal
echogenicity. Portal vein is patent on color Doppler imaging with
normal direction of blood flow towards the liver.
IMPRESSION: 1. Cholelithiasis with mild gallbladder wall thickening. Gallbladder
wall appears slightly edematous. Suspect early acute cholecystitis.

2. Prominence of the common bile duct proximally. More distally, the
common bile duct is obscured by gas. A distal calculus cannot be
excluded given this circumstance. From an imaging standpoint, MRCP
would be the imaging study of choice to further evaluate. There is a
focal area of apparent air within the intrahepatic biliary ductal
system. If patient has had previous sphincterotomy, air in the
biliary ductal system could be explained on an iatrogenic basis.
Given the other findings, early cholangitis is of concern given this
finding.

## 2020-07-25 IMAGING — CT CT ABDOMEN WITH CONTRAST
2 of 7 series · 15 of 46 positions shown, 17 images · IV contrast (Isovue)
Comparison: None.

CLINICAL DATA: Preprocedural evaluation prior to ERCP.

EXAM:
CT ABDOMEN WITH CONTRAST
TECHNIQUE: Multidetector CT imaging of the abdomen was performed using the
standard protocol following bolus administration of intravenous
contrast.
CONTRAST:  100mL OMNIPAQUE IOHEXOL 300 MG/ML  SOLN

[Series 2: axial st · axial · 0.73mm/px · z∈[-675,-450]mm · 12 of 55 slices shown, 14 images]
[im 5/55  soft-tissue]
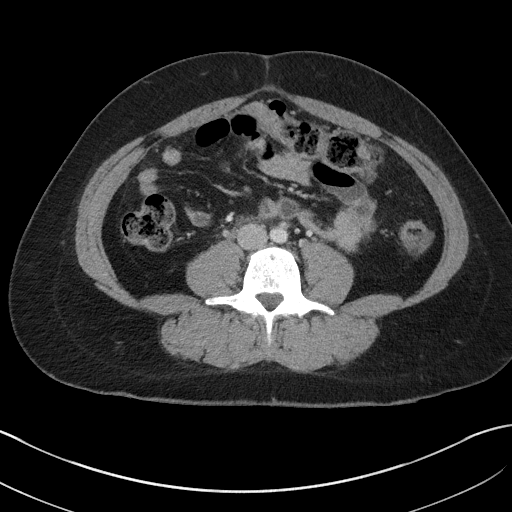
[im 5/55  bone]
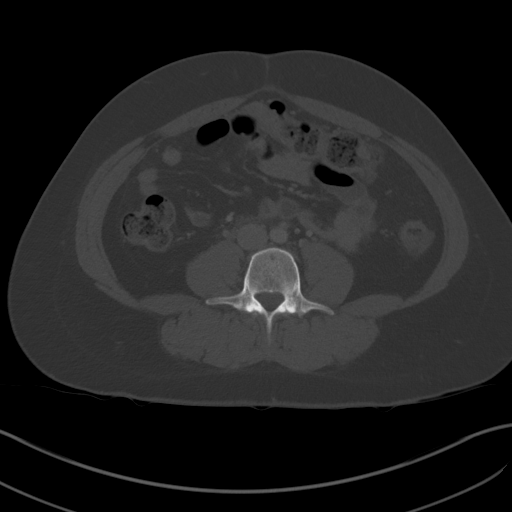
[im 9/55  soft-tissue]
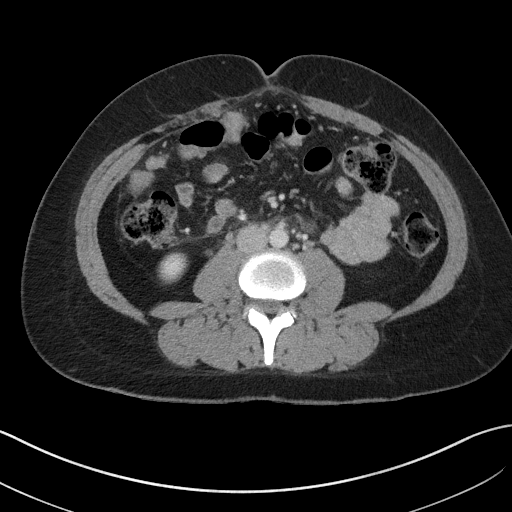
[im 13/55  soft-tissue]
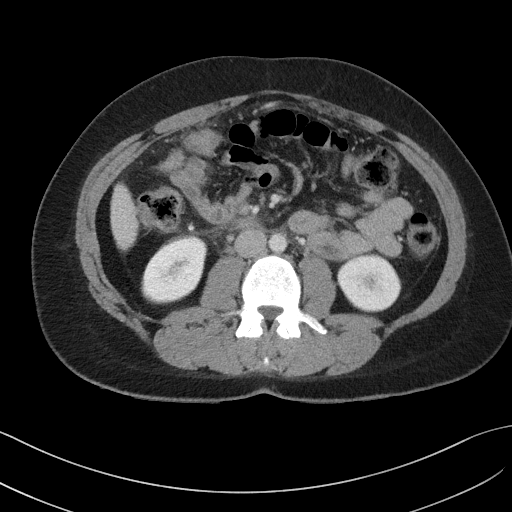
[im 17/55  soft-tissue]
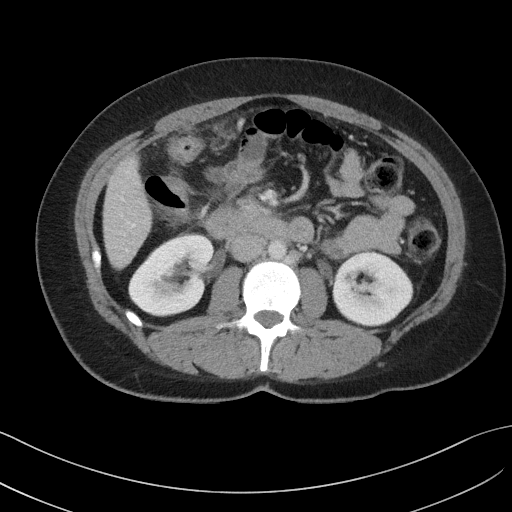
[im 21/55  soft-tissue]
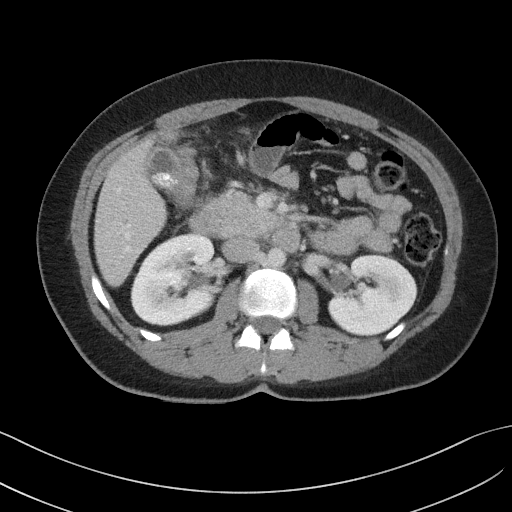
[im 25/55  soft-tissue]
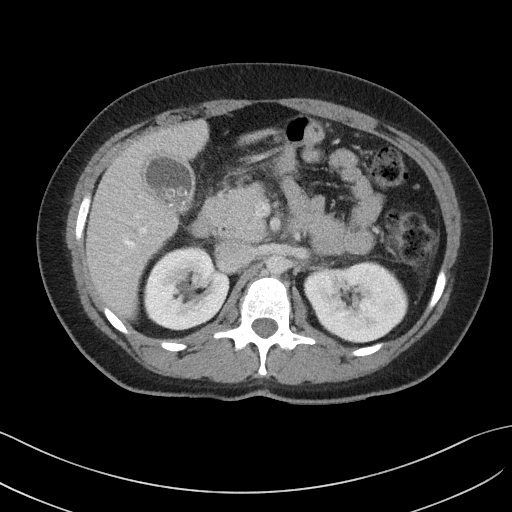
[im 30/55  soft-tissue]
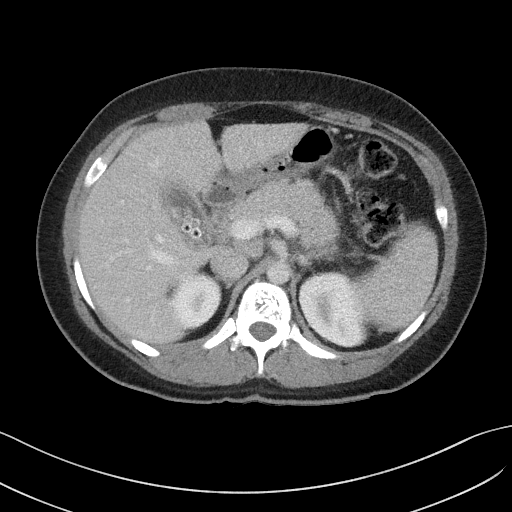
[im 34/55  soft-tissue]
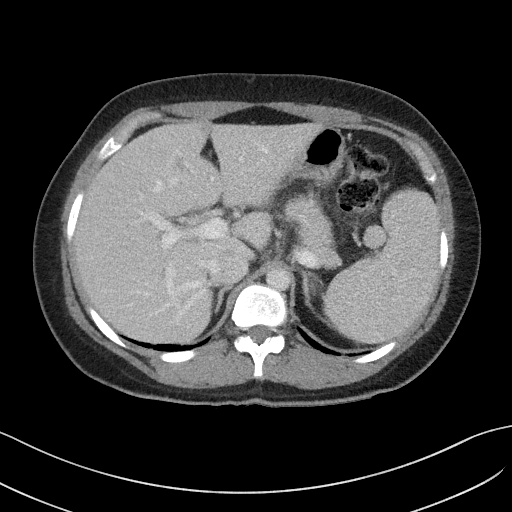
[im 38/55  soft-tissue]
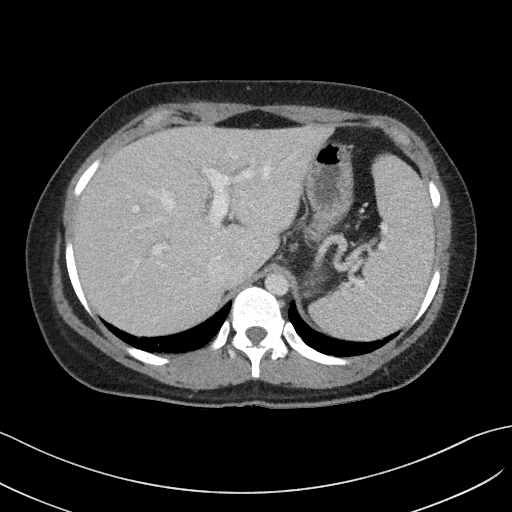
[im 38/55  bone]
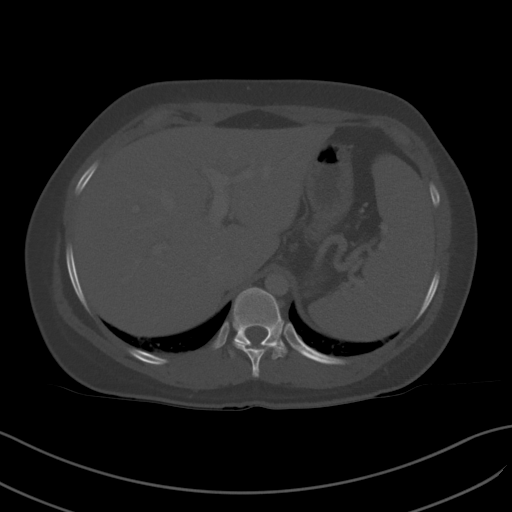
[im 42/55  soft-tissue]
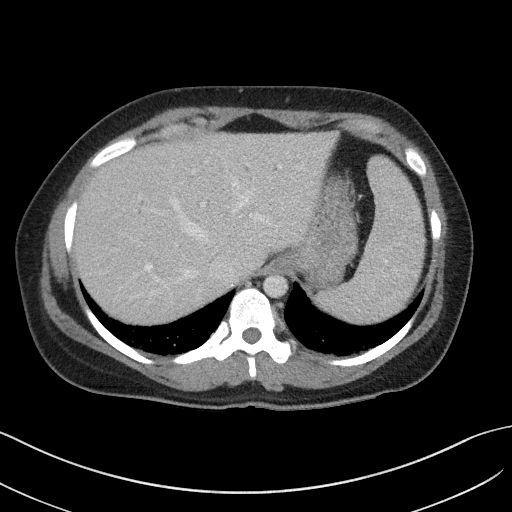
[im 46/55  soft-tissue]
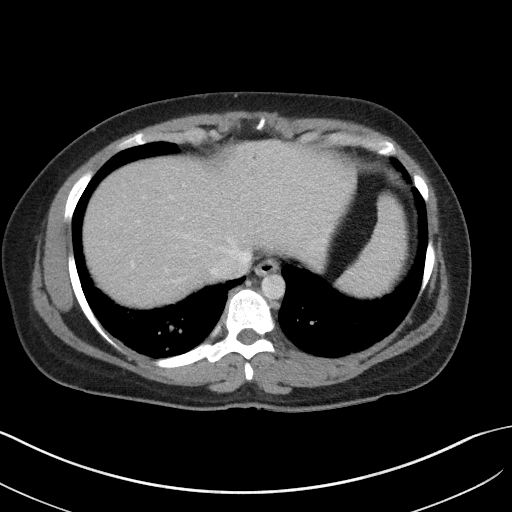
[im 50/55  soft-tissue]
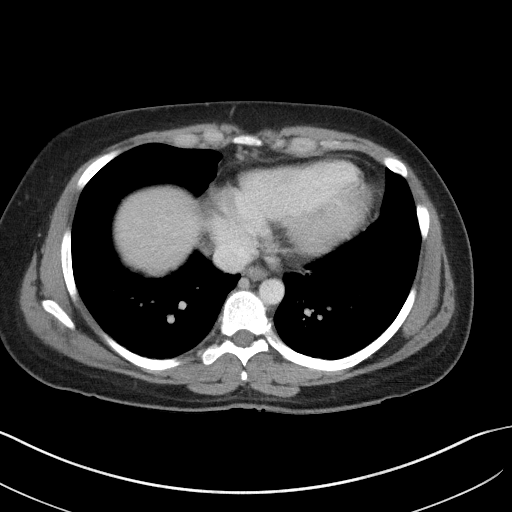

[Series 7: coronal st · coronal · 0.53mm/px · 3 of 103 slices shown]
[im 26/103  soft-tissue]
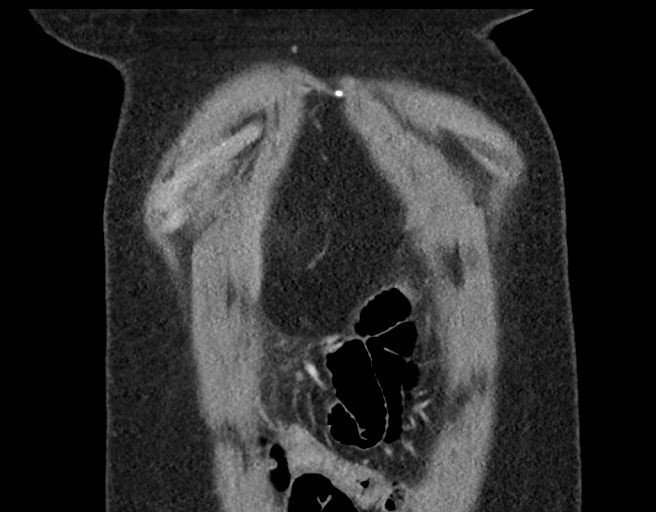
[im 52/103  soft-tissue]
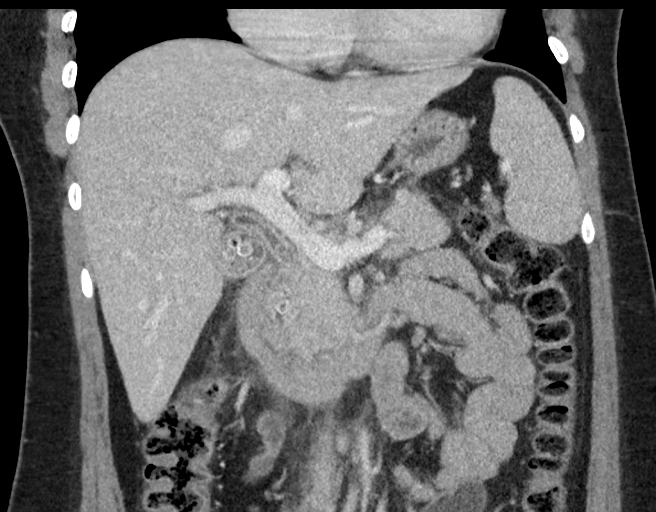
[im 77/103  soft-tissue]
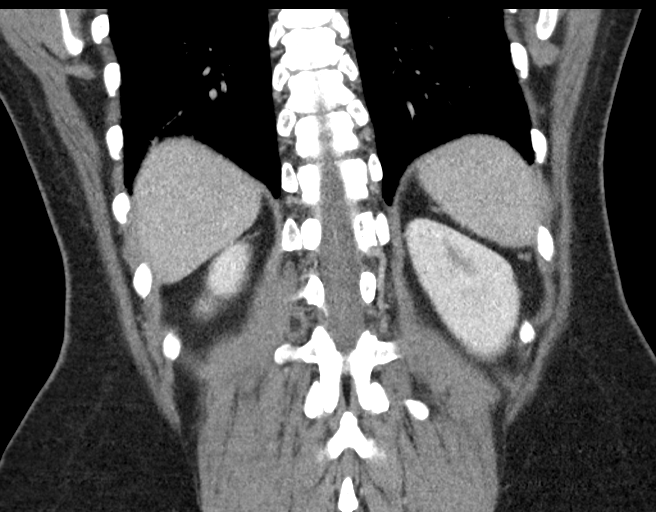

[15 of 46 positions shown; findings below may reference images not displayed]

FINDINGS: Lower chest: Normal heart size. Dependent atelectasis within the
bilateral lower lobes.

Hepatobiliary: The liver is normal in size and contour. No focal
hepatic lesion is identified. Multiple stones within the
gallbladder. There is gallbladder wall thickening and
pericholecystic fat stranding. Common bile duct is dilated measuring
up to 9 mm. Suggestion of enhancement of the wall of the common bile
duct and central intrahepatic bile ducts. There are at least 2
stones identified within the mid to distal common bile duct each
measuring approximately 5 mm (image 53; series 7).

Pancreas: There is fat stranding about the pancreatic head/uncinate
process.

Spleen: Unremarkable

Adrenals/Urinary Tract: Normal adrenal glands. Kidneys enhance
symmetrically with contrast. No hydronephrosis.

Stomach/Bowel: There is wall thickening of the second third portions
of the duodenum, potentially reactive in etiology. Additionally,
there is wall thickening of the hepatic flexure of the colon,
adjacent to the gallbladder (image 36; series 2), potentially
reactive in etiology. No free intraperitoneal air visualized small
bowel is unremarkable.

Vascular/Lymphatic: Normal caliber abdominal aorta. Circumaortic
left renal vein. No retroperitoneal lymphadenopathy.

Other: Small amount of fluid within the upper abdomen and pericolic
gutters bilaterally.

Musculoskeletal: No aggressive or acute appearing osseous lesions.
IMPRESSION: 1. There are at least 2 stones within the common bile duct
compatible with choledocholithiasis. Additionally, there is
gallbladder wall thickening and surrounding fat stranding suggestive
of acute cholecystitis. Apparent enhancement of the common bile duct
wall and central intrahepatic bile ducts may represent cholangitis.
2. There is fat stranding and small amount of fluid about the
pancreatic head and uncinate process which may represent associated
pancreatitis.
3. There is wall thickening of the second and third portions of the
duodenum as well as the hepatic flexure of the colon, favored to be
reactive in etiology from the adjacent acute process.
4. These results will be called to the ordering clinician or
representative by the Radiologist Assistant, and communication
documented in the PACS or zVision Dashboard.

## 2020-07-26 IMAGING — RF ERCP
1 series · 15 of 22 positions shown · non-contrast
Comparison: CT from the previous day

CLINICAL DATA: Choledocholithiasis

EXAM:
ERCP
TECHNIQUE: Multiple spot images obtained with the fluoroscopic device and
submitted for interpretation post-procedure.

[Series 1: run · 7 acquisitions, 15 frames shown]
[im 1/7]
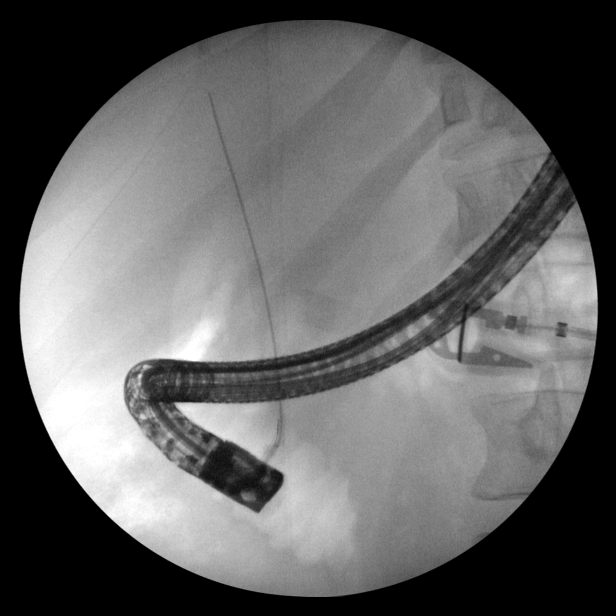
[im 1/7]
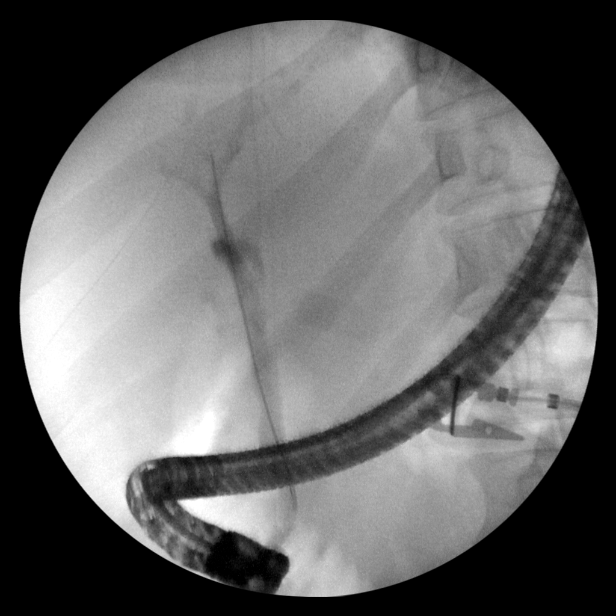
[im 1/7]
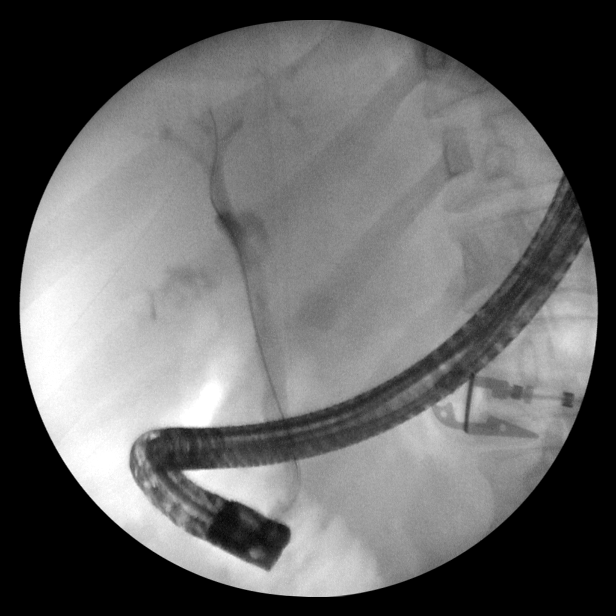
[im 2/7]
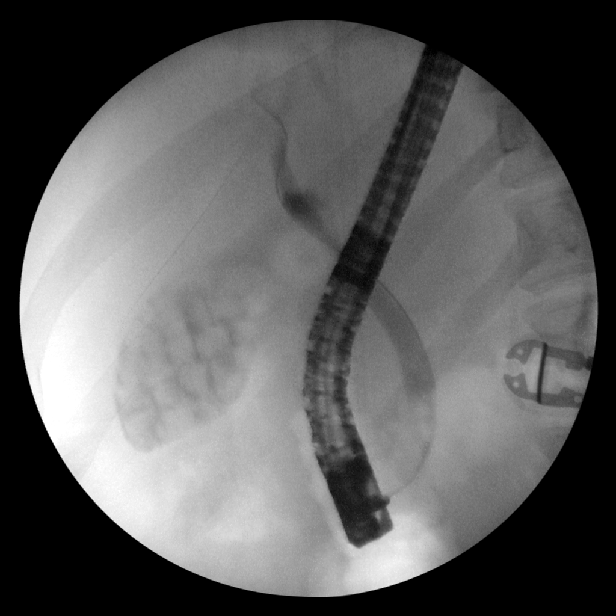
[im 2/7]
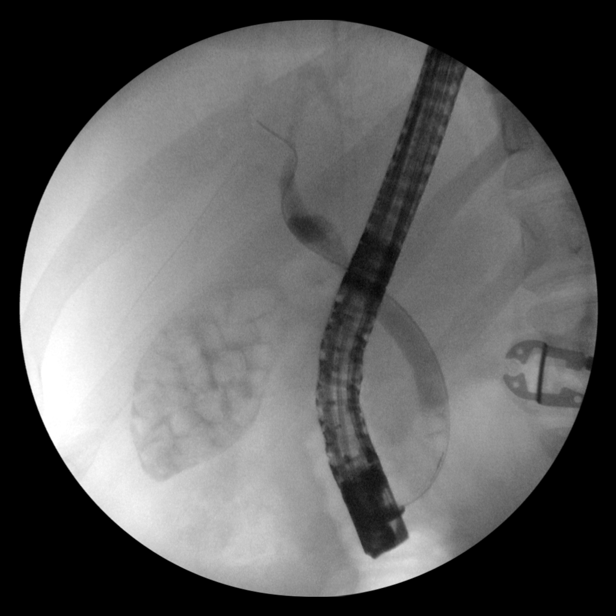
[im 3/7]
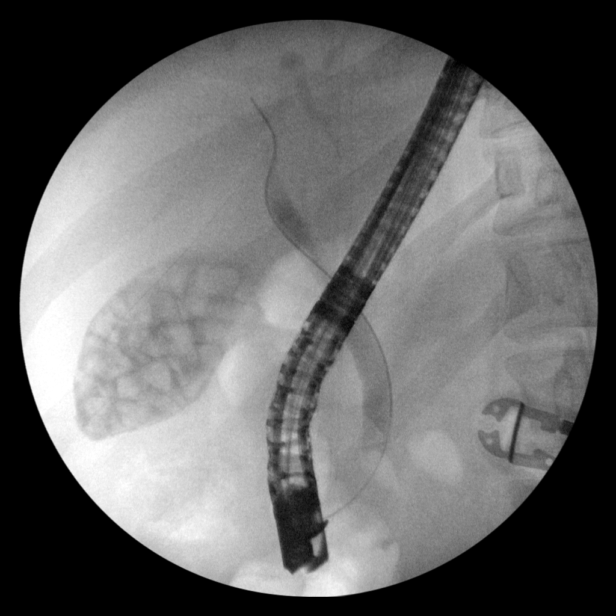
[im 4/7]
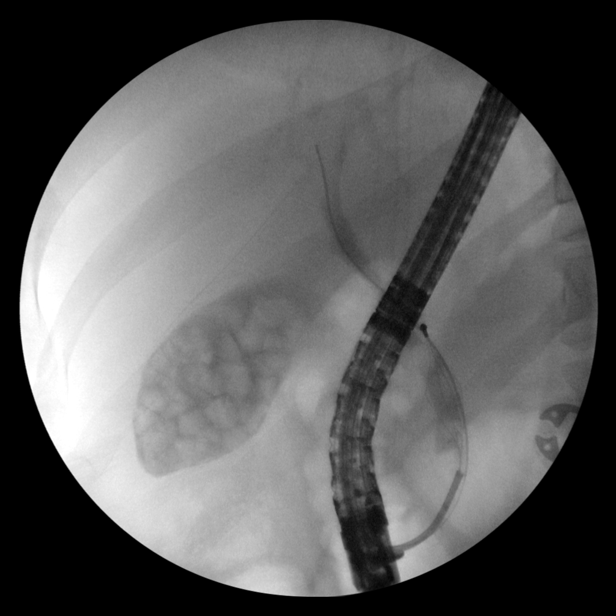
[im 4/7]
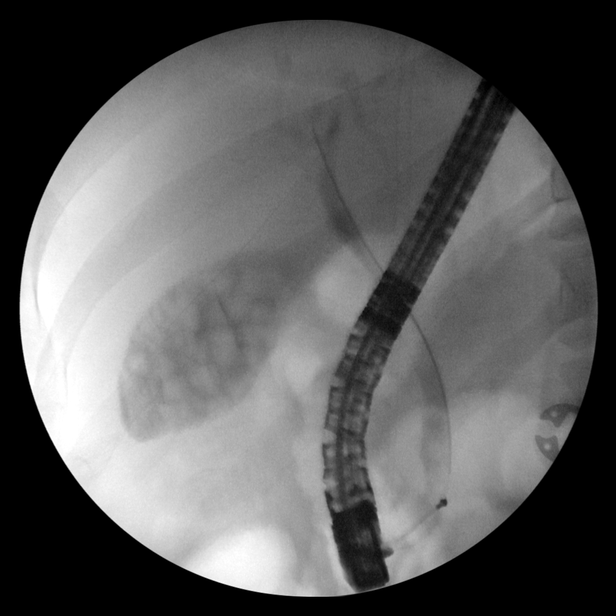
[im 4/7]
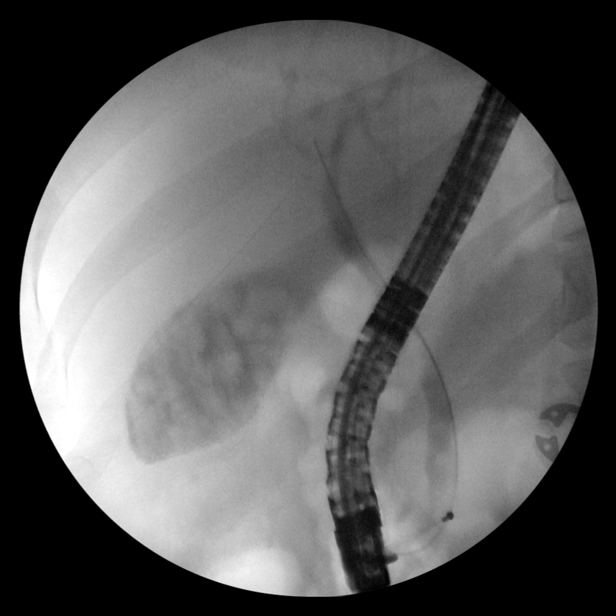
[im 5/7]
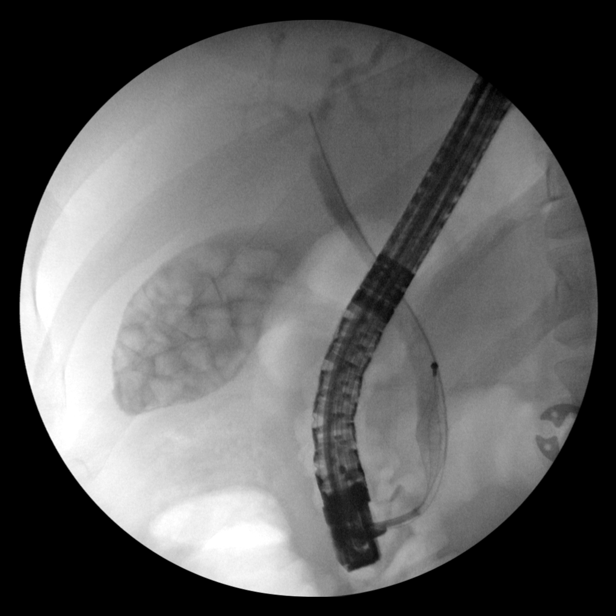
[im 5/7]
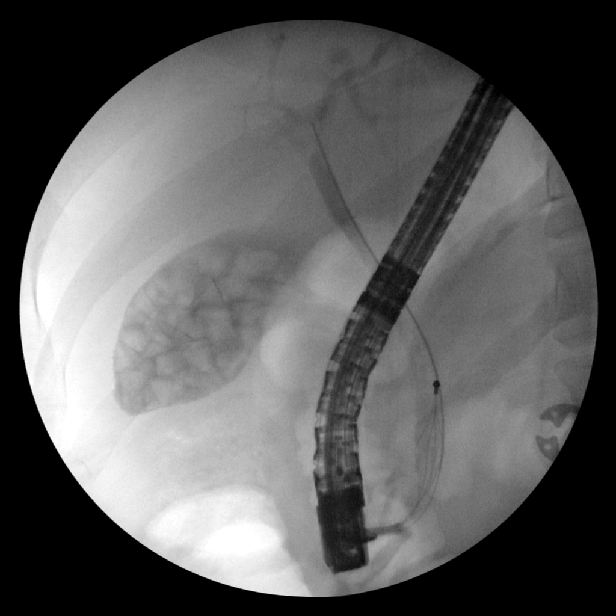
[im 5/7]
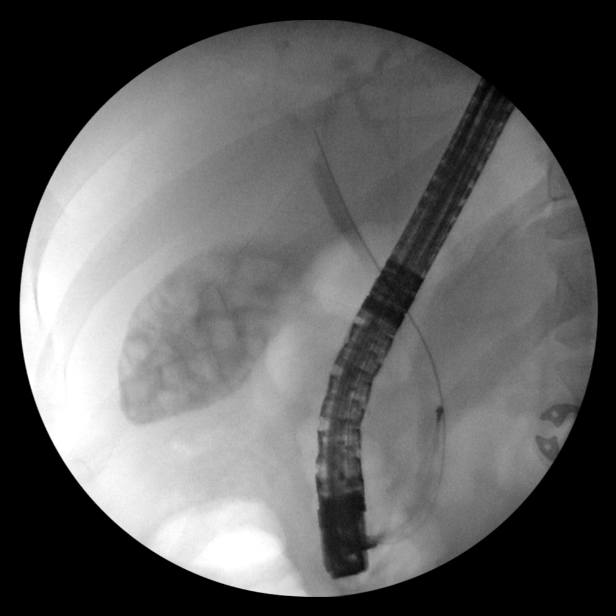
[im 6/7]
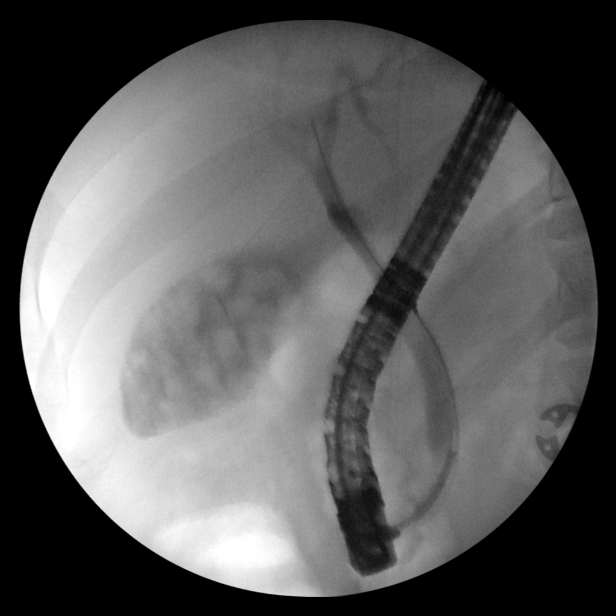
[im 6/7]
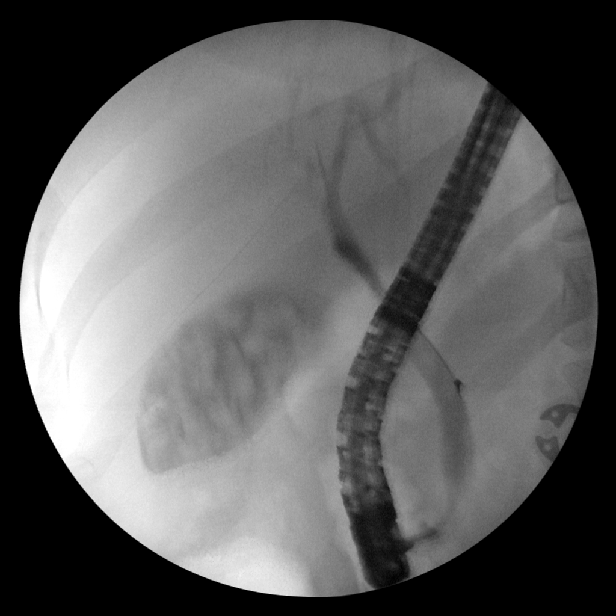
[im 7/7]
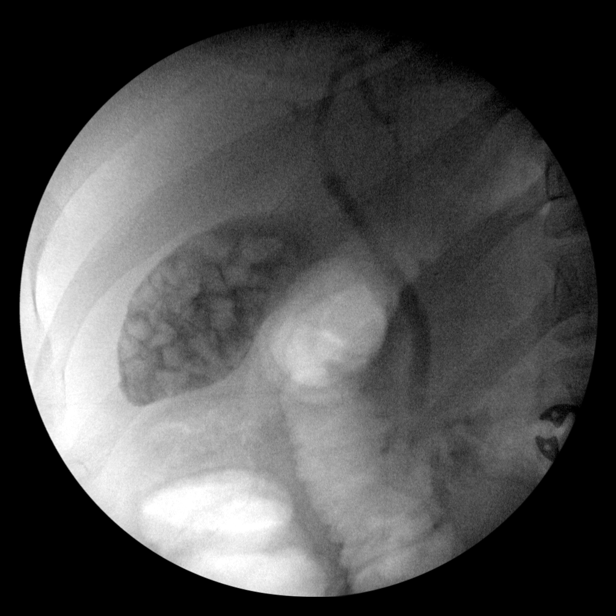

[15 of 22 positions shown; findings below may reference images not displayed]

FINDINGS: Series of fluoroscopic runs and images document endoscopic
cannulation and opacification of the CBD. At least 2 multifaceted
calculi in the distal CBD, near occlusive. Innumerable faceted
calculi are noted in the lumen of the gallbladder. Cystic duct is
patent. Incomplete opacification/visualization of the intrahepatic
biliary tree, which appears decompressed centrally. Images document
subsequent basket passage through the CBD with clearance of evident
calculi. No extravasation.
IMPRESSION: 1. Choledocholithiasis with endoscopic CBD cannulation and
intervention.
2. Cholelithiasis.

These images were submitted for radiologic interpretation only.
Please see the procedural report for the amount of contrast and the
fluoroscopy time utilized.

## 2020-12-17 ENCOUNTER — Ambulatory Visit: Payer: BC Managed Care – PPO | Admitting: Diagnostic Neuroimaging

## 2020-12-23 ENCOUNTER — Encounter: Payer: Self-pay | Admitting: *Deleted

## 2020-12-23 ENCOUNTER — Other Ambulatory Visit: Payer: Self-pay | Admitting: *Deleted

## 2020-12-29 ENCOUNTER — Encounter: Payer: Self-pay | Admitting: Diagnostic Neuroimaging

## 2020-12-29 ENCOUNTER — Ambulatory Visit (INDEPENDENT_AMBULATORY_CARE_PROVIDER_SITE_OTHER): Payer: BC Managed Care – PPO | Admitting: Diagnostic Neuroimaging

## 2020-12-29 VITALS — BP 126/84 | HR 70 | Ht 67.0 in | Wt 234.0 lb

## 2020-12-29 DIAGNOSIS — Z820 Family history of epilepsy and other diseases of the nervous system: Secondary | ICD-10-CM

## 2020-12-29 DIAGNOSIS — Z8279 Family history of other congenital malformations, deformations and chromosomal abnormalities: Secondary | ICD-10-CM

## 2020-12-29 NOTE — Progress Notes (Signed)
[facility]  PATIENT: Sonya Mcdonald DOB: 1990-12-18  REFERRING CLINICIAN: Royann Shivers, * HISTORY FROM: patient  REASON FOR VISIT: new consult    HISTORICAL  CHIEF COMPLAINT:  Chief Complaint  Patient presents with   New Patient (Initial Visit)    RM 6 alone Reports she here to discuss genetic testing Neurofibromatosis- mom, maternal aunt, and maternal gm have been dx.      HISTORY OF PRESENT ILLNESS:   30 year old female here for evaluation of family history of neurofibromatosis type I.  Patient's mother, maternal aunt and maternal grandmother have been diagnosed with neurofibromatosis type I.  Her maternal grandmother had some type of "tumors".  Her mother had testing because of family history and apparently was positive.  Patient's mother has had pain hypersensitivity issues.    Patient has noticed some increasing skin changes on her neck, chest and arms with raised nodules.    REVIEW OF SYSTEMS: Full 14 system review of systems performed and negative with exception of: as per HPI.  ALLERGIES: No Known Allergies  HOME MEDICATIONS: Outpatient Medications Prior to Visit  Medication Sig Dispense Refill   citalopram (CELEXA) 10 MG tablet Take 1 tablet by mouth daily. (Patient not taking: Reported on 12/29/2020)     LORazepam (ATIVAN) 0.5 MG tablet Take 0.5 mg by mouth daily as needed. (Patient not taking: Reported on 12/29/2020)     metroNIDAZOLE (FLAGYL) 500 MG tablet Take 1 tablet (500 mg total) by mouth 2 (two) times daily. 14 tablet 0   No facility-administered medications prior to visit.    PAST MEDICAL HISTORY: Past Medical History:  Diagnosis Date   Anxiety    Medical history non-contributory     PAST SURGICAL HISTORY: Past Surgical History:  Procedure Laterality Date   CHOLECYSTECTOMY N/A 01/13/2019   Procedure: LAPAROSCOPIC CHOLECYSTECTOMY;  Surgeon: Franky Macho, MD;  Location: [facility];  Service: General;  Laterality: N/A;    ERCP N/A 01/10/2019   Procedure: ENDOSCOPIC RETROGRADE CHOLANGIOPANCREATOGRAPHY (ERCP);  Surgeon: Malissa Hippo, MD;  Location: [facility];  Service: Gastroenterology;  Laterality: N/A;   REMOVAL OF STONES N/A 01/10/2019   Procedure: BALLOON DILATATION AND STONE EXTRACTION;  Surgeon: Malissa Hippo, MD;  Location: [facility];  Service: Gastroenterology;  Laterality: N/A;   SPHINCTEROTOMY N/A 01/10/2019   Procedure: SPHINCTEROTOMY;  Surgeon: Malissa Hippo, MD;  Location: [facility];  Service: Gastroenterology;  Laterality: N/A;   WISDOM TOOTH EXTRACTION      FAMILY HISTORY: Family History  Problem Relation Age of Onset   Seizures Mother    Neurofibromatosis Mother    Neurofibromatosis Maternal Grandmother    Neurofibromatosis Other     SOCIAL HISTORY: Social History   Socioeconomic History   Marital status: Single    Spouse name: Not on file   Number of children: Not on file   Years of education: Not on file   Highest education level: Not on file  Occupational History   Not on file  Tobacco Use   Smoking status: Never   Smokeless tobacco: Never  Vaping Use   Vaping Use: Never used  Substance and Sexual Activity   Alcohol use: Yes    Comment: occ   Drug use: Never   Sexual activity: Yes    Birth control/protection: Pill  Other Topics Concern   Not on file  Social History Narrative   Not on file   Social Determinants of Health   Financial Resource Strain: Not on file  Food Insecurity: Not on file  Transportation Needs: Not on file  Physical Activity: Not on file  Stress: Not on file  Social Connections: Not on file  Intimate Partner Violence: Not on file     PHYSICAL EXAM  GENERAL EXAM/CONSTITUTIONAL: Vitals:  Vitals:   12/29/20 1046  BP: 126/84  Pulse: 70  SpO2: 98%  Weight: 234 lb (106.1 kg)  Height: 5\' 7"  (1.702 m)   Body mass index is 36.65 kg/m. Wt Readings from Last 3 Encounters:  12/29/20 234 lb (106.1 kg)  01/09/19 196  lb (88.9 kg)   Patient is in no distress; well developed, nourished and groomed; neck is supple FEW SMALL RAISED NODULES ON FOREARM AND NECK  CARDIOVASCULAR: Examination of carotid arteries is normal; no carotid bruits Regular rate and rhythm, no murmurs Examination of peripheral vascular system by observation and palpation is normal  EYES: Ophthalmoscopic exam of optic discs and posterior segments is normal; no papilledema or hemorrhages No results found.  MUSCULOSKELETAL: Gait, strength, tone, movements noted in Neurologic exam below  NEUROLOGIC: MENTAL STATUS:  No flowsheet data found. awake, alert, oriented to person, place and time recent and remote memory intact normal attention and concentration language fluent, comprehension intact, naming intact fund of knowledge appropriate  CRANIAL NERVE:  2nd - no papilledema on fundoscopic exam 2nd, 3rd, 4th, 6th - pupils equal and reactive to light, visual fields full to confrontation, extraocular muscles intact, no nystagmus 5th - facial sensation symmetric 7th - facial strength symmetric 8th - hearing intact 9th - palate elevates symmetrically, uvula midline 11th - shoulder shrug symmetric 12th - tongue protrusion midline  MOTOR:  normal bulk and tone, full strength in the BUE, BLE  SENSORY:  normal and symmetric to light touch, temperature, vibration  COORDINATION:  finger-nose-finger, fine finger movements normal  REFLEXES:  deep tendon reflexes present and symmetric  GAIT/STATION:  narrow based gait     DIAGNOSTIC DATA (LABS, IMAGING, TESTING) - I reviewed patient records, labs, notes, testing and imaging myself where available.  Lab Results  Component Value Date   WBC 4.5 01/13/2019   HGB 14.2 01/13/2019   HCT 43.8 01/13/2019   MCV 89.2 01/13/2019   PLT 210 01/13/2019      Component Value Date/Time   NA 138 01/13/2019 0543   K 3.6 01/13/2019 0543   CL 104 01/13/2019 0543   CO2 25 01/13/2019  0543   GLUCOSE 108 (H) 01/13/2019 0543   BUN 7 01/13/2019 0543   CREATININE 0.52 01/13/2019 0543   CALCIUM 9.2 01/13/2019 0543   PROT 7.2 01/13/2019 0543   ALBUMIN 3.5 01/13/2019 0543   AST 66 (H) 01/13/2019 0543   ALT 103 (H) 01/13/2019 0543   ALKPHOS 270 (H) 01/13/2019 0543   BILITOT 1.5 (H) 01/13/2019 0543   GFRNONAA >60 01/13/2019 0543   GFRAA >60 01/13/2019 0543   No results found for: CHOL, HDL, LDLCALC, LDLDIRECT, TRIG, CHOLHDL No results found for: 01/15/2019 No results found for: VITAMINB12 No results found for: TSH     ASSESSMENT AND PLAN  30 y.o. year old female here with family history of neurofibromatosis type I in mother, maternal aunt and maternal grandmother.  Patient has some nonspecific skin findings.  Discussed options for further evaluation and screening testing.  Dx:  1. Family history of type 1 neurofibromatosis       PLAN:   Family history of NF1 + skin findings - refer to [facility] clinic or [facility] clinic for second opinion,  genetic counseling, and possible genetic testing for NF1   Orders Placed This Encounter  Procedures   Ambulatory referral to Neurology   Return for pending if symptoms worsen or fail to improve.    Suanne Marker, MD 12/29/2020, 11:08 AM Certified in Neurology, Neurophysiology and Neuroimaging  [facility] [address], [address] [place], [place] [address] [phone]

## 2020-12-29 NOTE — Patient Instructions (Signed)
Family history of NF1 + skin findings - refer to [facility] clinic or [facility] clinic for second opinion, genetic counseling, and possible genetic testing for NF1

## 2020-12-30 ENCOUNTER — Encounter: Payer: Self-pay | Admitting: Diagnostic Neuroimaging

## 2021-01-03 ENCOUNTER — Telehealth: Payer: Self-pay

## 2021-01-03 NOTE — Telephone Encounter (Signed)
Referral for neurofibromatosis clinic sent to [facility]. P: [phone].

## 2021-01-05 NOTE — Telephone Encounter (Signed)
Received call from [facility]. They received our referral, but they are asking for the results of the genetic testing report to be faxed to [phone]. If we do not have those results yet, please let me know and I will call Tasha back.

## 2021-01-05 NOTE — Telephone Encounter (Signed)
Sonya Mcdonald, it doesn't appear that we have ordered genetic testing. Dr. Marjory Lies deferred this to [facility]. Per his note: "refer to [facility] NF clinic or [facility] clinic for second opinion, genetic counseling, and possible genetic testing for NF1."

## 2021-01-06 NOTE — Telephone Encounter (Signed)
Spoke with Sonya Mcdonald at [facility]. She confirmed that she read the referral note incorrectly. She was able to get the referral approved.

## 2021-03-01 NOTE — Telephone Encounter (Addendum)
Received a letter dated 02/25/21 stating [facility] has attempted multiple times to contact pt to schedule an appointment and this referral order will be open for 6 months.  I will attempt to contact pt to get her to call their office for scheduling.   Contacted pt via MyChart

## 2021-03-31 DIAGNOSIS — R69 Illness, unspecified: Secondary | ICD-10-CM | POA: Diagnosis not present

## 2021-03-31 DIAGNOSIS — E6609 Other obesity due to excess calories: Secondary | ICD-10-CM | POA: Diagnosis not present

## 2022-04-19 DIAGNOSIS — F419 Anxiety disorder, unspecified: Secondary | ICD-10-CM | POA: Diagnosis not present

## 2022-04-19 DIAGNOSIS — R69 Illness, unspecified: Secondary | ICD-10-CM | POA: Diagnosis not present

## 2022-04-19 DIAGNOSIS — R030 Elevated blood-pressure reading, without diagnosis of hypertension: Secondary | ICD-10-CM | POA: Diagnosis not present

## 2022-04-19 DIAGNOSIS — Z6833 Body mass index (BMI) 33.0-33.9, adult: Secondary | ICD-10-CM | POA: Diagnosis not present

## 2022-05-10 DIAGNOSIS — Z6833 Body mass index (BMI) 33.0-33.9, adult: Secondary | ICD-10-CM | POA: Diagnosis not present

## 2022-05-10 DIAGNOSIS — F330 Major depressive disorder, recurrent, mild: Secondary | ICD-10-CM | POA: Diagnosis not present

## 2022-05-10 DIAGNOSIS — R69 Illness, unspecified: Secondary | ICD-10-CM | POA: Diagnosis not present

## 2022-05-10 DIAGNOSIS — R030 Elevated blood-pressure reading, without diagnosis of hypertension: Secondary | ICD-10-CM | POA: Diagnosis not present

## 2022-05-10 DIAGNOSIS — F419 Anxiety disorder, unspecified: Secondary | ICD-10-CM | POA: Diagnosis not present

## 2022-05-30 DIAGNOSIS — R69 Illness, unspecified: Secondary | ICD-10-CM | POA: Diagnosis not present

## 2022-05-30 DIAGNOSIS — F419 Anxiety disorder, unspecified: Secondary | ICD-10-CM | POA: Diagnosis not present

## 2022-05-30 DIAGNOSIS — Z6833 Body mass index (BMI) 33.0-33.9, adult: Secondary | ICD-10-CM | POA: Diagnosis not present

## 2022-05-30 DIAGNOSIS — F330 Major depressive disorder, recurrent, mild: Secondary | ICD-10-CM | POA: Diagnosis not present

## 2022-05-30 DIAGNOSIS — R030 Elevated blood-pressure reading, without diagnosis of hypertension: Secondary | ICD-10-CM | POA: Diagnosis not present

## 2022-09-06 DIAGNOSIS — R030 Elevated blood-pressure reading, without diagnosis of hypertension: Secondary | ICD-10-CM | POA: Diagnosis not present

## 2022-09-06 DIAGNOSIS — F419 Anxiety disorder, unspecified: Secondary | ICD-10-CM | POA: Diagnosis not present

## 2022-09-06 DIAGNOSIS — Z8279 Family history of other congenital malformations, deformations and chromosomal abnormalities: Secondary | ICD-10-CM | POA: Diagnosis not present

## 2022-09-06 DIAGNOSIS — F330 Major depressive disorder, recurrent, mild: Secondary | ICD-10-CM | POA: Diagnosis not present

## 2022-09-06 DIAGNOSIS — Z6832 Body mass index (BMI) 32.0-32.9, adult: Secondary | ICD-10-CM | POA: Diagnosis not present

## 2022-11-14 DIAGNOSIS — Q8500 Neurofibromatosis, unspecified: Secondary | ICD-10-CM | POA: Diagnosis not present

## 2022-11-21 DIAGNOSIS — Z6833 Body mass index (BMI) 33.0-33.9, adult: Secondary | ICD-10-CM | POA: Diagnosis not present

## 2022-11-21 DIAGNOSIS — F419 Anxiety disorder, unspecified: Secondary | ICD-10-CM | POA: Diagnosis not present

## 2022-11-21 DIAGNOSIS — R030 Elevated blood-pressure reading, without diagnosis of hypertension: Secondary | ICD-10-CM | POA: Diagnosis not present

## 2022-11-21 DIAGNOSIS — K649 Unspecified hemorrhoids: Secondary | ICD-10-CM | POA: Diagnosis not present

## 2022-11-21 DIAGNOSIS — F330 Major depressive disorder, recurrent, mild: Secondary | ICD-10-CM | POA: Diagnosis not present

## 2022-11-27 DIAGNOSIS — F411 Generalized anxiety disorder: Secondary | ICD-10-CM | POA: Diagnosis not present

## 2022-11-28 DIAGNOSIS — Z8279 Family history of other congenital malformations, deformations and chromosomal abnormalities: Secondary | ICD-10-CM | POA: Diagnosis not present

## 2022-11-28 DIAGNOSIS — Z8481 Family history of carrier of genetic disease: Secondary | ICD-10-CM | POA: Diagnosis not present

## 2022-11-29 DIAGNOSIS — Q8501 Neurofibromatosis, type 1: Secondary | ICD-10-CM | POA: Diagnosis not present

## 2022-12-14 ENCOUNTER — Encounter: Payer: Self-pay | Admitting: Gastroenterology

## 2022-12-14 ENCOUNTER — Ambulatory Visit (INDEPENDENT_AMBULATORY_CARE_PROVIDER_SITE_OTHER): Payer: 59 | Admitting: Gastroenterology

## 2022-12-14 VITALS — BP 130/84 | HR 75 | Temp 98.7°F | Ht 67.0 in | Wt 224.2 lb

## 2022-12-14 DIAGNOSIS — K641 Second degree hemorrhoids: Secondary | ICD-10-CM | POA: Diagnosis not present

## 2022-12-14 DIAGNOSIS — K5900 Constipation, unspecified: Secondary | ICD-10-CM

## 2022-12-14 NOTE — Progress Notes (Signed)
GI Office Note    Referring Provider: No ref. provider found Primary Care Physician:  Patient, No Pcp Per  Primary Gastroenterologist: Gerrit Friends.Rourk, MD  Chief Complaint   Chief Complaint  Patient presents with   New Patient (Initial Visit)    Pt referred for hemorrhoids   History of Present Illness   Kersten Brighton is a 32 y.o. female presenting today at the request of No ref. provider found for hemorrhoids.  ERCP July 2020: - The entire main bile duct and common bile duct were mildly dilated, with a stone causing an obstruction.  - Choledocholithiasis was found. Complete removal was accomplished by biliary sphincterotomy and basket extraction.  - A biliary sphincterotomy was performed.  - Major papilla was successfully dilated.  - The biliary tree was swept. - Avoid aspirin and nonsteroidal anti- inflammatory medicines for 3 days. - Cholecystectomy on 07/ 20/ 20.   Today: Hemorrhoids - Does have some intermittent protruding hemorrhoid tissue and they eventually go back in on there own. Has been happening since child birth.   Has intermittent diarrhea and constipation. Possibly related to certain foods. No abdominal pain.   No upper GI symptoms.   Has seen Dr. Marcha Solders in the past and was going to do surgery but then she decided against it. Told she had skin tags and internal hemorrhoids.   Has a greens drink a few times per week if she remembers.     Current Outpatient Medications  Medication Sig Dispense Refill   FLUoxetine (PROZAC) 40 MG capsule Take 40 mg by mouth daily.     LORazepam (ATIVAN) 0.5 MG tablet Take 0.5 mg by mouth daily as needed.     No current facility-administered medications for this visit.    Past Medical History:  Diagnosis Date   Anxiety    Medical history non-contributory     Past Surgical History:  Procedure Laterality Date   CHOLECYSTECTOMY N/A 01/13/2019   Procedure: LAPAROSCOPIC CHOLECYSTECTOMY;  Surgeon: Franky Macho, MD;   Location: [facility];  Service: General;  Laterality: N/A;   ERCP N/A 01/10/2019   Procedure: ENDOSCOPIC RETROGRADE CHOLANGIOPANCREATOGRAPHY (ERCP);  Surgeon: Malissa Hippo, MD;  Location: [facility];  Service: Gastroenterology;  Laterality: N/A;   REMOVAL OF STONES N/A 01/10/2019   Procedure: BALLOON DILATATION AND STONE EXTRACTION;  Surgeon: Malissa Hippo, MD;  Location: [facility];  Service: Gastroenterology;  Laterality: N/A;   SPHINCTEROTOMY N/A 01/10/2019   Procedure: SPHINCTEROTOMY;  Surgeon: Malissa Hippo, MD;  Location: [facility];  Service: Gastroenterology;  Laterality: N/A;   WISDOM TOOTH EXTRACTION      Family History  Problem Relation Age of Onset   Seizures Mother    Neurofibromatosis Mother    Neurofibromatosis Maternal Grandmother    Neurofibromatosis Other     Allergies as of 12/14/2022   (No Known Allergies)    Social History   Socioeconomic History   Marital status: Single    Spouse name: Not on file   Number of children: Not on file   Years of education: Not on file   Highest education level: Not on file  Occupational History   Not on file  Tobacco Use   Smoking status: Never   Smokeless tobacco: Never  Vaping Use   Vaping Use: Never used  Substance and Sexual Activity   Alcohol use: Yes    Comment: occ   Drug use: Never   Sexual activity: Yes    Birth control/protection: Pill  Other Topics Concern   Not on file  Social History Narrative   Not on file   Social Determinants of Health   Financial Resource Strain: Not on file  Food Insecurity: Not on file  Transportation Needs: Not on file  Physical Activity: Not on file  Stress: Not on file  Social Connections: Not on file  Intimate Partner Violence: Not on file     Review of Systems   Gen: Denies any fever, chills, fatigue, weight loss, lack of appetite.  CV: Denies chest pain, heart palpitations, peripheral edema, syncope.  Resp: Denies shortness of breath at rest or  with exertion. Denies wheezing or cough.  GI: see HPI GU : Denies urinary burning, urinary frequency, urinary hesitancy MS: Denies joint pain, muscle weakness, cramps, or limitation of movement.  Derm: Denies rash, itching, dry skin Psych: Denies depression, anxiety, memory loss, and confusion Heme: Denies bruising, bleeding, and enlarged lymph nodes.   Physical Exam   BP 130/84   Pulse 75   Temp 98.7 F (37.1 C)   Ht 5\' 7"  (1.702 m)   Wt 224 lb 3.2 oz (101.7 kg)   LMP 11/30/2022   BMI 35.11 kg/m   General:   Alert and oriented. Pleasant and cooperative. Well-nourished and well-developed.  Head:  Normocephalic and atraumatic. Eyes:  Without icterus, sclera clear and conjunctiva pink.  Ears:  Normal auditory acuity. Mouth:  No deformity or lesions, oral mucosa pink.  Lungs:  Clear to auscultation bilaterally. No wheezes, rales, or rhonchi. No distress.  Heart:  S1, S2 present without murmurs appreciated.  Abdomen:  +BS, soft, non-tender and non-distended. No HSM noted. No guarding or rebound. No masses appreciated.  Rectal:  Deferred  Msk:  Symmetrical without gross deformities. Normal posture. Extremities:  Without edema. Neurologic:  Alert and  oriented x4;  grossly normal neurologically. Skin:  Intact without significant lesions or rashes. Psych:  Alert and cooperative. Normal mood and affect.   Assessment   Mikeyla Mcnaught is a 33 y.o. female with a history of anxiety and constipation presenting today to discuss   Hemorrhoids, Constipation: Has had intermittent constipation for an unknown period of time.  She has reported some intermittent diarrhea which is likely overflow.  She usually has some urgency related to this.  Has been having intermittent issues with hemorrhoids as well, they usually protrude some go back in on her own but does not always occur with every bowel movement.  She has had hemorrhoids present since she began having children, usually occurs  intermittently for the last 11 years or so.  Initially consider surgical excision and then changed her mind and wanted to proceed with a nonsurgical approach.  We discussed the banding procedure at length today including possible complications and what to expect.  She would like to proceed with this or we will schedule her banding at her earliest convenience.  If she tolerates the first procedure well then we may consider double banding on the second appointment due to time constraints of getting off of work.  Encouraged her daily fiber supplementation to help with her constipation and adequate hydration.  PLAN   Banding pamphlet provided. Will set up for hemorrhoid banding Start daily fiber supplement or greens drink. Hemorrhoid precautions advised.   Brooke Bonito, MSN, FNP-BC, AGACNP-BC [facility]

## 2022-12-14 NOTE — Patient Instructions (Addendum)
If you would like to proceed with hemorrhoid banding please make an appointment at the front desk for any time.  Continue to avoid straining. Limit toilet time to 2-3 minutes at the most.  Avoid constipation. Take 2 tablespoons of natural wheat bran, natural oat bran, flax, Benefiber or any over the counter fiber supplement and increase your water intake to 7-8 glasses daily.  It was a pleasure to see you today. I want to create trusting relationships with patients. If you receive a survey regarding your visit,  I greatly appreciate you taking time to fill this out on paper or through your MyChart. I value your feedback.  Brooke Bonito, MSN, FNP-BC, AGACNP-BC [facility]

## 2022-12-19 ENCOUNTER — Encounter: Payer: Self-pay | Admitting: Gastroenterology

## 2022-12-20 DIAGNOSIS — Z6833 Body mass index (BMI) 33.0-33.9, adult: Secondary | ICD-10-CM | POA: Diagnosis not present

## 2022-12-20 DIAGNOSIS — F330 Major depressive disorder, recurrent, mild: Secondary | ICD-10-CM | POA: Diagnosis not present

## 2022-12-20 DIAGNOSIS — R030 Elevated blood-pressure reading, without diagnosis of hypertension: Secondary | ICD-10-CM | POA: Diagnosis not present

## 2022-12-20 DIAGNOSIS — K649 Unspecified hemorrhoids: Secondary | ICD-10-CM | POA: Diagnosis not present

## 2022-12-20 DIAGNOSIS — F419 Anxiety disorder, unspecified: Secondary | ICD-10-CM | POA: Diagnosis not present

## 2023-01-10 ENCOUNTER — Ambulatory Visit (INDEPENDENT_AMBULATORY_CARE_PROVIDER_SITE_OTHER): Payer: 59 | Admitting: Gastroenterology

## 2023-01-10 ENCOUNTER — Encounter: Payer: Self-pay | Admitting: Gastroenterology

## 2023-01-10 VITALS — BP 111/75 | HR 73 | Temp 97.7°F | Ht 67.0 in | Wt 219.8 lb

## 2023-01-10 DIAGNOSIS — K641 Second degree hemorrhoids: Secondary | ICD-10-CM | POA: Diagnosis not present

## 2023-01-10 NOTE — Patient Instructions (Addendum)
Continue to avoid straining. Limit toilet time to 2-3 minutes at the most.   Avoid constipation. Take 2 tablespoons of natural wheat bran, natural oat bran, flax, Benefiber or any over the counter fiber supplement and increase your water intake to 7-8 glasses daily.  Occasionally, you may have more bleeding than usual after the banding procedure. This is often from the untreated hemorrhoids rather than the treated one. Don't be concerned if there is a tablespoon or so of blood. If there is more blood than this, lie flat with your bottom higher than your head and apply an ice pack to the area. If the bleeding does not stop within a half an hour or if you feel faint, have severe pain, chills, fever or difficulty passing urine (very rare) or other problems, you should call us at [phone] or report to the nearest emergency room. Please call me with any concerns!  The procedure you have had should have been relatively painless since the banding of the area involved does not have nerve endings and there is no pain sensation. The rubber band cuts off the blood supply to the hemorrhoid and the band may fall off as soon as 48 hours after the banding (the band may occasionally be seen in the toilet bowl following a bowel movement). You may notice a temporary feeling of fullness in the rectum which should respond adequately to plain Tylenol or Motrin.  I will see you back in 2-3 weeks for additional banding.   Brooke Bonito, MSN, FNP-BC, AGACNP-BC [facility]

## 2023-01-10 NOTE — Progress Notes (Signed)
   [facility] Banding Procedure Note:   Sonya Mcdonald is a 32 y.o. female presenting today for consideration of hemorrhoid banding. No prior colonoscopy. She has had intermittent issues with hemorrhoids since multiple childbirths.  Latex Allergy: No  The patient presents with symptomatic grade 2 hemorrhoids, unresponsive to maximal medical therapy, requesting rubber band ligation of his/her hemorrhoidal disease. All risks, benefits, and alternative forms of therapy were described and informed consent was obtained.  In the left lateral decubitus position (if anoscopy is performed) anoscopic examination revealed grade 1/2 hemorrhoids in the right posterior and right anterior positions. Also with some external skin tags and mild rectal mucosal prolapse.   The decision was made to band the right posterior internal hemorrhoid, and the [facility] O'Regan System was used to perform band ligation without complication. Digital anorectal examination was then performed to assure proper positioning of the band, and to adjust the banded tissue as required. The patient was discharged home without pain or other issues. Dietary and behavioral recommendations were given and (if necessary prescriptions were given), along with follow-up instructions. The patient will return in 2-3 weeks  for additional banding as required.  No complications were encountered and the patient tolerated the procedure well. Discussed ongoing ways to reduce constipation including stool softeners as needed.   Brooke Bonito, MSN, FNP-BC, AGACNP-BC [facility]

## 2023-01-19 DIAGNOSIS — F419 Anxiety disorder, unspecified: Secondary | ICD-10-CM | POA: Diagnosis not present

## 2023-01-19 DIAGNOSIS — F330 Major depressive disorder, recurrent, mild: Secondary | ICD-10-CM | POA: Diagnosis not present

## 2023-01-19 DIAGNOSIS — K649 Unspecified hemorrhoids: Secondary | ICD-10-CM | POA: Diagnosis not present

## 2023-02-01 ENCOUNTER — Encounter: Payer: Self-pay | Admitting: Gastroenterology

## 2023-02-01 ENCOUNTER — Ambulatory Visit (INDEPENDENT_AMBULATORY_CARE_PROVIDER_SITE_OTHER): Payer: 59 | Admitting: Gastroenterology

## 2023-02-01 VITALS — BP 129/85 | HR 88 | Temp 98.0°F | Ht 67.0 in | Wt 220.2 lb

## 2023-02-01 DIAGNOSIS — K641 Second degree hemorrhoids: Secondary | ICD-10-CM

## 2023-02-01 NOTE — Progress Notes (Signed)
   [facility] Banding Procedure Note:   Sonya Mcdonald is a 32 y.o. female presenting today for consideration of hemorrhoid banding. No prior colonoscopy.   Latex Allergy: No  Interval History: No bleeding, itching, pain recently. Has history of this. Spending 5 minutes on toilet at a time. Patient requests to have both remaining columns banded today.   The patient presents with symptomatic grade 2 hemorrhoids, unresponsive to maximal medical therapy, requesting rubber band ligation of his/her hemorrhoidal disease. All risks, benefits, and alternative forms of therapy were described and informed consent was obtained.  The decision was made to band the right anterior and left lateral internal hemorrhoid, and the [facility] O'Regan System was used to perform band ligation without complication. Digital anorectal examination was then performed to assure proper positioning of the band, and to adjust the banded tissue as required. The patient was discharged home without pain or other issues. Dietary and behavioral recommendations were given along with follow-up instructions. The patient will return in 2-3 weeks for followup and possible additional banding as required.  No complications were encountered and the patient tolerated the procedure well.    Brooke Bonito, MSN, FNP-BC, AGACNP-BC [facility]

## 2023-02-01 NOTE — Patient Instructions (Signed)
Continue to avoid straining. Limit toilet time to 2-3 minutes at the most.   Avoid constipation. Take 2 tablespoons of natural wheat bran, natural oat bran, flax, Benefiber or any over the counter fiber supplement and increase your water intake to 7-8 glasses daily.  Occasionally, you may have more bleeding than usual after the banding procedure. This is often from the untreated hemorrhoids rather than the treated one. Don't be concerned if there is a tablespoon or so of blood. If there is more blood than this, lie flat with your bottom higher than your head and apply an ice pack to the area. If the bleeding does not stop within a half an hour or if you feel faint, have severe pain, chills, fever or difficulty passing urine (very rare) or other problems, you should call us at [phone] or report to the nearest emergency room. Please call me with any concerns!  The procedure you have had should have been relatively painless since the banding of the area involved does not have nerve endings and there is no pain sensation. The rubber band cuts off the blood supply to the hemorrhoid and the band may fall off as soon as 48 hours after the banding (the band may occasionally be seen in the toilet bowl following a bowel movement). You may notice a temporary feeling of fullness in the rectum which should respond adequately to plain Tylenol or Motrin.  I will see you back in 4 weeks for follow-up.   Brooke Bonito, MSN, FNP-BC, AGACNP-BC [facility]

## 2023-02-27 DIAGNOSIS — R635 Abnormal weight gain: Secondary | ICD-10-CM | POA: Diagnosis not present

## 2023-02-27 DIAGNOSIS — Z6835 Body mass index (BMI) 35.0-35.9, adult: Secondary | ICD-10-CM | POA: Diagnosis not present

## 2023-02-28 DIAGNOSIS — R635 Abnormal weight gain: Secondary | ICD-10-CM | POA: Diagnosis not present

## 2023-03-03 DIAGNOSIS — F32A Depression, unspecified: Secondary | ICD-10-CM | POA: Diagnosis not present

## 2023-03-03 DIAGNOSIS — F419 Anxiety disorder, unspecified: Secondary | ICD-10-CM | POA: Diagnosis not present

## 2023-03-03 DIAGNOSIS — Z6835 Body mass index (BMI) 35.0-35.9, adult: Secondary | ICD-10-CM | POA: Diagnosis not present

## 2023-03-03 DIAGNOSIS — E781 Pure hyperglyceridemia: Secondary | ICD-10-CM | POA: Diagnosis not present

## 2023-03-03 DIAGNOSIS — E88818 Other insulin resistance: Secondary | ICD-10-CM | POA: Diagnosis not present

## 2023-03-03 DIAGNOSIS — R635 Abnormal weight gain: Secondary | ICD-10-CM | POA: Diagnosis not present

## 2023-03-20 ENCOUNTER — Encounter: Payer: Self-pay | Admitting: Gastroenterology

## 2023-03-20 ENCOUNTER — Ambulatory Visit (INDEPENDENT_AMBULATORY_CARE_PROVIDER_SITE_OTHER): Payer: 59 | Admitting: Gastroenterology

## 2023-03-20 VITALS — BP 116/82 | HR 80 | Temp 97.3°F | Ht 66.0 in | Wt 218.5 lb

## 2023-03-20 DIAGNOSIS — K644 Residual hemorrhoidal skin tags: Secondary | ICD-10-CM

## 2023-03-20 DIAGNOSIS — K5900 Constipation, unspecified: Secondary | ICD-10-CM

## 2023-03-20 DIAGNOSIS — K641 Second degree hemorrhoids: Secondary | ICD-10-CM

## 2023-03-20 NOTE — Progress Notes (Unsigned)
GI Office Note    Referring Provider: No ref. provider found Primary Care Physician:  Patient, No Pcp Per Primary Gastroenterologist: Gerrit Friends.Rourk, MD   Date:  03/20/2023  ID:  Sonya Mcdonald, DOB [birth year], MRN [id number]   Chief Complaint   Chief Complaint  Patient presents with   Follow-up    Patient here today due to rectal bleeding when she wipes.    History of Present Illness  Sonya Mcdonald is a 32 y.o. female with a history of anxiety and constipation presenting today for follow-up post hemorrhoid banding  ERCP July 2020: - The entire main bile duct and common bile duct were mildly dilated, with a stone causing an obstruction.  - Choledocholithiasis was found. Complete removal was accomplished by biliary sphincterotomy and basket extraction.  - A biliary sphincterotomy was performed.  - Major papilla was successfully dilated.  - The biliary tree was swept. - Avoid aspirin and nonsteroidal anti- inflammatory medicines for 3 days. - Cholecystectomy on 07/ 20/ 20.  Seen for evaluation of hemorrhoids 6-20/24.  Reported intermittent hemorrhoid protruding tissue and eventually go back and on her own.  Had had symptoms since childbirth.  Having intermittent diarrhea and constipation possibly related to certain foods.  Has a green drinks a few times per week if she remembers to take it.  Advised to start daily fiber supplement or continue greens drink.  Discussed hemorrhoid banding.  In the interim between her last visit she has had hemorrhoid banding x 3.  Today: [place] Oyster about 3 weeks ago.  Has had some worsening constipation with some harder stools since beginning it.  Has been using her greens but not as often.  Denies any rectal pain, nausea, vomiting, GERD, lack appetite, early satiety, abdominal pain.  Would like to consider any additional treatments for her external hemorrhoids.  Not interested in repeating any banding currently although we did discuss neutral  banding.  She would like to avoid surgery if possible and mostly just wants them gone for cosmetic purposes.  Current Outpatient Medications  Medication Sig Dispense Refill   buPROPion (WELLBUTRIN XL) 150 MG 24 hr tablet Take 150 mg by mouth daily.     LORazepam (ATIVAN) 0.5 MG tablet Take 0.5 mg by mouth daily as needed.     WEGOVY 0.25 MG/0.5ML SOAJ Inject 0.25 mg into the skin once a week.     No current facility-administered medications for this visit.    Past Medical History:  Diagnosis Date   Anxiety    Medical history non-contributory     Past Surgical History:  Procedure Laterality Date   CHOLECYSTECTOMY N/A 01/13/2019   Procedure: LAPAROSCOPIC CHOLECYSTECTOMY;  Surgeon: Franky Macho, MD;  Location: [facility];  Service: General;  Laterality: N/A;   ERCP N/A 01/10/2019   Procedure: ENDOSCOPIC RETROGRADE CHOLANGIOPANCREATOGRAPHY (ERCP);  Surgeon: Malissa Hippo, MD;  Location: [facility];  Service: Gastroenterology;  Laterality: N/A;   REMOVAL OF STONES N/A 01/10/2019   Procedure: BALLOON DILATATION AND STONE EXTRACTION;  Surgeon: Malissa Hippo, MD;  Location: [facility];  Service: Gastroenterology;  Laterality: N/A;   SPHINCTEROTOMY N/A 01/10/2019   Procedure: SPHINCTEROTOMY;  Surgeon: Malissa Hippo, MD;  Location: [facility];  Service: Gastroenterology;  Laterality: N/A;   WISDOM TOOTH EXTRACTION      Family History  Problem Relation Age of Onset   Seizures Mother    Neurofibromatosis Mother    Neurofibromatosis Maternal Grandmother    Neurofibromatosis Other  Allergies as of 03/20/2023   (No Known Allergies)    Social History   Socioeconomic History   Marital status: Single    Spouse name: Not on file   Number of children: Not on file   Years of education: Not on file   Highest education level: Not on file  Occupational History   Not on file  Tobacco Use   Smoking status: Never   Smokeless tobacco: Never  Vaping Use   Vaping status:  Never Used  Substance and Sexual Activity   Alcohol use: Yes    Comment: occ   Drug use: Never   Sexual activity: Yes    Birth control/protection: Pill  Other Topics Concern   Not on file  Social History Narrative   Not on file   Social Determinants of Health   Financial Resource Strain: Not on file  Food Insecurity: Not on file  Transportation Needs: Not on file  Physical Activity: Inactive (09/05/2018)   Received [facility]   Exercise Vital Sign    Days of Exercise per Week: 0 days    Minutes of Exercise per Session: 0 min  Stress: Not on file  Social Connections: Not on file     Review of Systems   Gen: Denies fever, chills, anorexia. Denies fatigue, weakness, weight loss.  CV: Denies chest pain, palpitations, syncope, peripheral edema, and claudication. Resp: Denies dyspnea at rest, cough, wheezing, coughing up blood, and pleurisy. GI: See HPI Derm: Denies rash, itching, dry skin Psych: Denies depression, anxiety, memory loss, confusion. No homicidal or suicidal ideation.  Heme: Denies bruising, bleeding, and enlarged lymph nodes.  Physical Exam   BP 116/82 (BP Location: Left Arm, Patient Position: Sitting, Cuff Size: Normal)   Pulse 80   Temp (!) 97.3 F (36.3 C) (Temporal)   Ht 5\' 6"  (1.676 m)   Wt 218 lb 8 oz (99.1 kg)   BMI 35.27 kg/m   General:   Alert and oriented. No distress noted. Pleasant and cooperative.  Head:  Normocephalic and atraumatic. Eyes:  Conjuctiva clear without scleral icterus. Mouth:  Oral mucosa pink and moist. Good dentition. No lesions. Abdomen:  +BS, soft, non-tender and non-distended. No rebound or guarding. No HSM or masses noted. Rectal: deferred Msk:  Symmetrical without gross deformities. Normal posture. Extremities:  Without edema. Neurologic:  Alert and  oriented x4 Psych:  Alert and cooperative. Normal mood and affect.   Assessment  Sonya Mcdonald is a 32 y.o. female with a history of  anxiety and constipation presenting today for follow-up post hemorrhoid banding.  Constipation: Has a baseline mild constipation.  Had some improvement with increase fiber in her diet however has been worsening since starting on Wegovy 3 weeks ago.  We discussed the potential for Wegovy to cause worsening constipation especially acidosis increased.  For now advised stool softener once daily, can increase up to 3 times daily given she has been having some harder stools without significant straining.  Hemorrhoids: History of hemorrhoids since childbirth.  Symptoms present for the last 11 years or more.  Initially consider surgical excision however changed her mind once investigating this further.  She has underwent hemorrhoid banding x 3 to her internal hemorrhoids.  Continues to have some mild external hemorrhoid and skin tags.  Would like to avoid surgical excision therefore she would like to explore further options of treatment more for cosmetic purposes.  Occasionally still has some mild toilet tissue hematochezia.  Discussed neutral banding  today however we will hold for now as this is likely secondary to hard stools and need to control constipation better.  For her external skin tags I did advise to discuss this further with her dermatologist as if it meets the threshold is being far enough away from the anal canal that sometimes dermatology will remove these.  Dermatology will not remove external hemorrhoids but may be able to help with skin tags.  Patient plans to discuss with them.  PLAN   Colace 100 mg once daily, can increase up to 3 times daily Increase water intake High-fiber diet Over-the-counter hemorrhoid cream as needed Will plan to investigate any additional potential options for treatment of external skin tags - encouraged to discuss with dermatology.  Follow up as needed if worsening constipation.     Brooke Bonito, MSN, FNP-BC, AGACNP-BC [facility]

## 2023-03-20 NOTE — Patient Instructions (Addendum)
You can continue to use over-the-counter hemorrhoid cream as needed for any ongoing rectal bleeding. Please let me know if you would like to schedule a neutral banding as if you feel like there is any residual tissue we can try to treat with another banding.  Some of the rectal bleeding could be due to hard stools giving some constipation.  As we discussed please start with a daily stool softener you should get Colace (docusate sodium) 100 mg.  You can take this up to 3 times a day as needed.  If constipation begins to worsen please let me know and I can give you some additional over-the-counter recommendations.  Worsening constipation is a common side effect of increased doses of Wegovy.  Plan to follow-up as needed.  If you need any additional recommendations to help with over-the-counter options for your constipation please let me know.  It was a pleasure to see you today. I want to create trusting relationships with patients. If you receive a survey regarding your visit,  I greatly appreciate you taking time to fill this out on paper or through your MyChart. I value your feedback.  Brooke Bonito, MSN, FNP-BC, AGACNP-BC [facility]

## 2023-03-28 DIAGNOSIS — L538 Other specified erythematous conditions: Secondary | ICD-10-CM | POA: Diagnosis not present

## 2023-03-28 DIAGNOSIS — D485 Neoplasm of uncertain behavior of skin: Secondary | ICD-10-CM | POA: Diagnosis not present

## 2023-03-28 DIAGNOSIS — D2362 Other benign neoplasm of skin of left upper limb, including shoulder: Secondary | ICD-10-CM | POA: Diagnosis not present

## 2023-03-28 DIAGNOSIS — L568 Other specified acute skin changes due to ultraviolet radiation: Secondary | ICD-10-CM | POA: Diagnosis not present

## 2023-03-28 DIAGNOSIS — D2262 Melanocytic nevi of left upper limb, including shoulder: Secondary | ICD-10-CM | POA: Diagnosis not present

## 2023-03-28 DIAGNOSIS — R208 Other disturbances of skin sensation: Secondary | ICD-10-CM | POA: Diagnosis not present

## 2023-04-03 DIAGNOSIS — E8881 Metabolic syndrome: Secondary | ICD-10-CM | POA: Diagnosis not present

## 2023-04-03 DIAGNOSIS — E6609 Other obesity due to excess calories: Secondary | ICD-10-CM | POA: Diagnosis not present

## 2023-04-07 DIAGNOSIS — Z6834 Body mass index (BMI) 34.0-34.9, adult: Secondary | ICD-10-CM | POA: Diagnosis not present

## 2023-04-07 DIAGNOSIS — R635 Abnormal weight gain: Secondary | ICD-10-CM | POA: Diagnosis not present

## 2023-04-16 DIAGNOSIS — Z6833 Body mass index (BMI) 33.0-33.9, adult: Secondary | ICD-10-CM | POA: Diagnosis not present

## 2023-04-16 DIAGNOSIS — R030 Elevated blood-pressure reading, without diagnosis of hypertension: Secondary | ICD-10-CM | POA: Diagnosis not present

## 2023-04-16 DIAGNOSIS — R0683 Snoring: Secondary | ICD-10-CM | POA: Diagnosis not present

## 2023-04-16 DIAGNOSIS — R5383 Other fatigue: Secondary | ICD-10-CM | POA: Diagnosis not present

## 2023-04-28 DIAGNOSIS — G4733 Obstructive sleep apnea (adult) (pediatric): Secondary | ICD-10-CM | POA: Diagnosis not present

## 2023-04-28 DIAGNOSIS — R635 Abnormal weight gain: Secondary | ICD-10-CM | POA: Diagnosis not present

## 2023-04-28 DIAGNOSIS — E781 Pure hyperglyceridemia: Secondary | ICD-10-CM | POA: Diagnosis not present

## 2023-04-28 DIAGNOSIS — E88819 Insulin resistance, unspecified: Secondary | ICD-10-CM | POA: Diagnosis not present

## 2023-04-28 DIAGNOSIS — K5900 Constipation, unspecified: Secondary | ICD-10-CM | POA: Diagnosis not present

## 2023-04-28 DIAGNOSIS — Z6835 Body mass index (BMI) 35.0-35.9, adult: Secondary | ICD-10-CM | POA: Diagnosis not present

## 2023-05-22 DIAGNOSIS — Z6835 Body mass index (BMI) 35.0-35.9, adult: Secondary | ICD-10-CM | POA: Diagnosis not present

## 2023-05-22 DIAGNOSIS — R635 Abnormal weight gain: Secondary | ICD-10-CM | POA: Diagnosis not present

## 2023-05-22 DIAGNOSIS — E781 Pure hyperglyceridemia: Secondary | ICD-10-CM | POA: Diagnosis not present

## 2023-05-22 DIAGNOSIS — E88819 Insulin resistance, unspecified: Secondary | ICD-10-CM | POA: Diagnosis not present

## 2023-05-22 DIAGNOSIS — K5900 Constipation, unspecified: Secondary | ICD-10-CM | POA: Diagnosis not present

## 2023-07-06 DIAGNOSIS — R030 Elevated blood-pressure reading, without diagnosis of hypertension: Secondary | ICD-10-CM | POA: Diagnosis not present

## 2023-07-06 DIAGNOSIS — Z6832 Body mass index (BMI) 32.0-32.9, adult: Secondary | ICD-10-CM | POA: Diagnosis not present

## 2023-07-06 DIAGNOSIS — F330 Major depressive disorder, recurrent, mild: Secondary | ICD-10-CM | POA: Diagnosis not present

## 2023-07-06 DIAGNOSIS — F419 Anxiety disorder, unspecified: Secondary | ICD-10-CM | POA: Diagnosis not present

## 2023-07-06 DIAGNOSIS — K649 Unspecified hemorrhoids: Secondary | ICD-10-CM | POA: Diagnosis not present

## 2023-07-16 DIAGNOSIS — E781 Pure hyperglyceridemia: Secondary | ICD-10-CM | POA: Diagnosis not present

## 2023-07-16 DIAGNOSIS — E88819 Insulin resistance, unspecified: Secondary | ICD-10-CM | POA: Diagnosis not present

## 2023-07-16 DIAGNOSIS — E669 Obesity, unspecified: Secondary | ICD-10-CM | POA: Diagnosis not present

## 2023-07-16 DIAGNOSIS — Z1389 Encounter for screening for other disorder: Secondary | ICD-10-CM | POA: Diagnosis not present

## 2023-07-16 DIAGNOSIS — Z6834 Body mass index (BMI) 34.0-34.9, adult: Secondary | ICD-10-CM | POA: Diagnosis not present

## 2023-07-18 DIAGNOSIS — Z01419 Encounter for gynecological examination (general) (routine) without abnormal findings: Secondary | ICD-10-CM | POA: Diagnosis not present

## 2023-08-16 DIAGNOSIS — E88819 Insulin resistance, unspecified: Secondary | ICD-10-CM | POA: Diagnosis not present

## 2023-08-16 DIAGNOSIS — E781 Pure hyperglyceridemia: Secondary | ICD-10-CM | POA: Diagnosis not present

## 2023-08-16 DIAGNOSIS — Z6835 Body mass index (BMI) 35.0-35.9, adult: Secondary | ICD-10-CM | POA: Diagnosis not present

## 2023-09-05 DIAGNOSIS — R635 Abnormal weight gain: Secondary | ICD-10-CM | POA: Diagnosis not present

## 2023-09-14 DIAGNOSIS — Z6835 Body mass index (BMI) 35.0-35.9, adult: Secondary | ICD-10-CM | POA: Diagnosis not present

## 2023-09-14 DIAGNOSIS — E781 Pure hyperglyceridemia: Secondary | ICD-10-CM | POA: Diagnosis not present

## 2023-09-14 DIAGNOSIS — E88819 Insulin resistance, unspecified: Secondary | ICD-10-CM | POA: Diagnosis not present

## 2023-09-26 DIAGNOSIS — L2989 Other pruritus: Secondary | ICD-10-CM | POA: Diagnosis not present

## 2023-09-26 DIAGNOSIS — L820 Inflamed seborrheic keratosis: Secondary | ICD-10-CM | POA: Diagnosis not present

## 2023-09-26 DIAGNOSIS — L538 Other specified erythematous conditions: Secondary | ICD-10-CM | POA: Diagnosis not present

## 2023-09-26 DIAGNOSIS — D2362 Other benign neoplasm of skin of left upper limb, including shoulder: Secondary | ICD-10-CM | POA: Diagnosis not present

## 2023-10-02 DIAGNOSIS — J342 Deviated nasal septum: Secondary | ICD-10-CM | POA: Diagnosis not present

## 2023-10-02 DIAGNOSIS — R0981 Nasal congestion: Secondary | ICD-10-CM | POA: Diagnosis not present

## 2023-10-02 DIAGNOSIS — J343 Hypertrophy of nasal turbinates: Secondary | ICD-10-CM | POA: Diagnosis not present

## 2023-10-02 DIAGNOSIS — R0683 Snoring: Secondary | ICD-10-CM | POA: Diagnosis not present

## 2023-10-14 DIAGNOSIS — E6609 Other obesity due to excess calories: Secondary | ICD-10-CM | POA: Diagnosis not present

## 2023-10-14 DIAGNOSIS — Z6835 Body mass index (BMI) 35.0-35.9, adult: Secondary | ICD-10-CM | POA: Diagnosis not present

## 2023-10-14 DIAGNOSIS — E88819 Insulin resistance, unspecified: Secondary | ICD-10-CM | POA: Diagnosis not present

## 2023-10-14 DIAGNOSIS — E781 Pure hyperglyceridemia: Secondary | ICD-10-CM | POA: Diagnosis not present

## 2023-10-18 DIAGNOSIS — N930 Postcoital and contact bleeding: Secondary | ICD-10-CM | POA: Diagnosis not present

## 2023-10-18 DIAGNOSIS — N941 Unspecified dyspareunia: Secondary | ICD-10-CM | POA: Diagnosis not present

## 2023-10-18 DIAGNOSIS — K5900 Constipation, unspecified: Secondary | ICD-10-CM | POA: Diagnosis not present

## 2023-10-29 DIAGNOSIS — N941 Unspecified dyspareunia: Secondary | ICD-10-CM | POA: Diagnosis not present

## 2023-10-29 DIAGNOSIS — N8003 Adenomyosis of the uterus: Secondary | ICD-10-CM | POA: Diagnosis not present

## 2023-10-29 DIAGNOSIS — N930 Postcoital and contact bleeding: Secondary | ICD-10-CM | POA: Diagnosis not present

## 2023-11-12 DIAGNOSIS — R0981 Nasal congestion: Secondary | ICD-10-CM | POA: Diagnosis not present

## 2023-11-12 DIAGNOSIS — J34829 Nasal valve collapse, unspecified: Secondary | ICD-10-CM | POA: Diagnosis not present

## 2023-11-12 DIAGNOSIS — J343 Hypertrophy of nasal turbinates: Secondary | ICD-10-CM | POA: Diagnosis not present

## 2023-11-12 DIAGNOSIS — J342 Deviated nasal septum: Secondary | ICD-10-CM | POA: Diagnosis not present

## 2023-11-15 DIAGNOSIS — N941 Unspecified dyspareunia: Secondary | ICD-10-CM | POA: Diagnosis not present

## 2023-11-15 DIAGNOSIS — R5383 Other fatigue: Secondary | ICD-10-CM | POA: Diagnosis not present

## 2023-11-15 DIAGNOSIS — N920 Excessive and frequent menstruation with regular cycle: Secondary | ICD-10-CM | POA: Diagnosis not present

## 2023-11-15 DIAGNOSIS — N930 Postcoital and contact bleeding: Secondary | ICD-10-CM | POA: Diagnosis not present

## 2023-11-15 DIAGNOSIS — K5900 Constipation, unspecified: Secondary | ICD-10-CM | POA: Diagnosis not present

## 2023-11-26 ENCOUNTER — Other Ambulatory Visit: Payer: Self-pay | Admitting: Otolaryngology

## 2024-01-01 ENCOUNTER — Encounter (HOSPITAL_BASED_OUTPATIENT_CLINIC_OR_DEPARTMENT_OTHER): Admission: RE | Payer: Self-pay | Source: Home / Self Care

## 2024-01-01 ENCOUNTER — Ambulatory Visit (HOSPITAL_BASED_OUTPATIENT_CLINIC_OR_DEPARTMENT_OTHER): Admission: RE | Admit: 2024-01-01 | Source: Home / Self Care | Admitting: Otolaryngology

## 2024-01-01 SURGERY — SEPTOPLASTY, NOSE
Anesthesia: General | Laterality: Bilateral

## 2024-08-11 ENCOUNTER — Encounter: Admitting: Gastroenterology
